# Patient Record
Sex: Female | Born: 1995 | Race: White | Hispanic: No | Smoking: Never smoker
Health system: Southern US, Community
[De-identification: ages and names within clinical notes are randomized; demographics above are authoritative.]

## PROBLEM LIST (undated history)

## (undated) DIAGNOSIS — F319 Bipolar disorder, unspecified: Secondary | ICD-10-CM

## (undated) DIAGNOSIS — Z8759 Personal history of other complications of pregnancy, childbirth and the puerperium: Secondary | ICD-10-CM

## (undated) DIAGNOSIS — G43509 Persistent migraine aura without cerebral infarction, not intractable, without status migrainosus: Secondary | ICD-10-CM

## (undated) DIAGNOSIS — B009 Herpesviral infection, unspecified: Secondary | ICD-10-CM

## (undated) DIAGNOSIS — F429 Obsessive-compulsive disorder, unspecified: Secondary | ICD-10-CM

## (undated) DIAGNOSIS — O1490 Unspecified pre-eclampsia, unspecified trimester: Secondary | ICD-10-CM

## (undated) DIAGNOSIS — T7422XA Child sexual abuse, confirmed, initial encounter: Secondary | ICD-10-CM

## (undated) DIAGNOSIS — F32A Depression, unspecified: Secondary | ICD-10-CM

## (undated) DIAGNOSIS — F419 Anxiety disorder, unspecified: Secondary | ICD-10-CM

## (undated) DIAGNOSIS — K08409 Partial loss of teeth, unspecified cause, unspecified class: Secondary | ICD-10-CM

## (undated) HISTORY — DX: Depression, unspecified: F32.A

## (undated) HISTORY — DX: Personal history of other complications of pregnancy, childbirth and the puerperium: Z87.59

## (undated) HISTORY — DX: Persistent migraine aura without cerebral infarction, not intractable, without status migrainosus: G43.509

## (undated) HISTORY — DX: Herpesviral infection, unspecified: B00.9

## (undated) HISTORY — PX: OTHER SURGICAL HISTORY: SHX169

## (undated) HISTORY — DX: Bipolar disorder, unspecified: F31.9

## (undated) HISTORY — DX: Anxiety disorder, unspecified: F41.9

## (undated) HISTORY — DX: Unspecified pre-eclampsia, unspecified trimester: O14.90

## (undated) HISTORY — DX: Child sexual abuse, confirmed, initial encounter: T74.22XA

## (undated) HISTORY — PX: WISDOM TOOTH EXTRACTION: SHX21

## (undated) HISTORY — PX: TONSILLECTOMY: SUR1361

## (undated) HISTORY — PX: TYMPANOSTOMY TUBE PLACEMENT: SHX32

## (undated) HISTORY — DX: Partial loss of teeth, unspecified cause, unspecified class: K08.409

## (undated) HISTORY — DX: Obsessive-compulsive disorder, unspecified: F42.9

---
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
## 2017-10-04 DIAGNOSIS — O10919 Unspecified pre-existing hypertension complicating pregnancy, unspecified trimester: Secondary | ICD-10-CM | POA: Insufficient documentation

## 2021-06-24 DIAGNOSIS — F99 Mental disorder, not otherwise specified: Secondary | ICD-10-CM | POA: Insufficient documentation

## 2021-06-24 DIAGNOSIS — O0990 Supervision of high risk pregnancy, unspecified, unspecified trimester: Secondary | ICD-10-CM | POA: Insufficient documentation

## 2021-06-24 DIAGNOSIS — Z3480 Encounter for supervision of other normal pregnancy, unspecified trimester: Secondary | ICD-10-CM | POA: Insufficient documentation

## 2021-06-24 LAB — OB RESULTS CONSOLE GC/CHLAMYDIA
Chlamydia: NEGATIVE
Neisseria Gonorrhea: NEGATIVE

## 2021-06-24 LAB — OB RESULTS CONSOLE HEPATITIS B SURFACE ANTIGEN: Hepatitis B Surface Ag: NEGATIVE

## 2021-06-24 LAB — HEPATITIS C ANTIBODY: HCV Ab: NEGATIVE

## 2021-06-26 DIAGNOSIS — O2340 Unspecified infection of urinary tract in pregnancy, unspecified trimester: Secondary | ICD-10-CM | POA: Insufficient documentation

## 2021-06-28 NOTE — L&D Delivery Note (Signed)
OB/GYN Faculty Practice Delivery Note  Amber Dennis is a 26 y.o. [id number] s/p SVD at [redacted]w[redacted]d. She was admitted for IOL for cHTN with superimposed severe pre-eclampsia.   ROM: 6h 25m with clear fluid GBS Status: positive Maximum Maternal Temperature: 98.4  Labor Progress: Presented for IOL, received cytotec, a foley balloon, and was ultimately started on pitocin.  She then [facility] and progressed to complete a few hours later  Delivery Date/Time: 1638 on 7/12 Delivery: Called to room and patient was complete and pushing. Head delivered OA. Nuchal cord x1  present and delivered through. Shoulder and body delivered in usual fashion. Infant withiout spontaneous cry, placed on mother's abdomen, dried and stimulated and tone improved and did start to cry. Cord clamped x 2 after 1-minute delay, and cut by father of baby under my direct supervision. Cord blood drawn. Placenta delivered spontaneously with gentle cord traction. Fundus firm with massage and Pitocin. Labia, perineum, vagina, and cervix inspected and found to have a 1st degree laceration that was repaired with a 4-0 monocryl with one figure of eight stitch.  Of note, due to patient's elevated hemorrhage risk (PreE, Mg, and suspected LGA infant) she was given TXA at delivery and a manual lower uterine segment sweep was done and two medium sized clots were removed.  Placenta: intact, 3V cord, to L&D Complications: none Lacerations: 1st degree EBL: 200cc Analgesia: epidural  Infant: female  APGARs 7,9  3650g  Warner Mccreedy, MD, MPH OB Fellow, Faculty Practice Center for [facility], [facility]

## 2021-07-03 DIAGNOSIS — Z789 Other specified health status: Secondary | ICD-10-CM | POA: Insufficient documentation

## 2021-07-09 DIAGNOSIS — O98519 Other viral diseases complicating pregnancy, unspecified trimester: Secondary | ICD-10-CM | POA: Insufficient documentation

## 2021-07-09 DIAGNOSIS — U071 COVID-19: Secondary | ICD-10-CM | POA: Insufficient documentation

## 2021-07-15 ENCOUNTER — Encounter: Payer: Self-pay | Admitting: *Deleted

## 2021-07-15 DIAGNOSIS — Z3480 Encounter for supervision of other normal pregnancy, unspecified trimester: Secondary | ICD-10-CM | POA: Insufficient documentation

## 2021-07-23 ENCOUNTER — Ambulatory Visit (INDEPENDENT_AMBULATORY_CARE_PROVIDER_SITE_OTHER): Payer: Medicaid Other | Admitting: Obstetrics and Gynecology

## 2021-07-23 ENCOUNTER — Encounter: Payer: Self-pay | Admitting: Obstetrics and Gynecology

## 2021-07-23 ENCOUNTER — Other Ambulatory Visit: Payer: Self-pay

## 2021-07-23 VITALS — BP 139/83 | HR 102 | Ht 66.0 in | Wt 191.0 lb

## 2021-07-23 DIAGNOSIS — Z3A13 13 weeks gestation of pregnancy: Secondary | ICD-10-CM

## 2021-07-23 DIAGNOSIS — O98311 Other infections with a predominantly sexual mode of transmission complicating pregnancy, first trimester: Secondary | ICD-10-CM

## 2021-07-23 DIAGNOSIS — Z789 Other specified health status: Secondary | ICD-10-CM | POA: Diagnosis not present

## 2021-07-23 DIAGNOSIS — Z3480 Encounter for supervision of other normal pregnancy, unspecified trimester: Secondary | ICD-10-CM

## 2021-07-23 DIAGNOSIS — O10911 Unspecified pre-existing hypertension complicating pregnancy, first trimester: Secondary | ICD-10-CM

## 2021-07-23 DIAGNOSIS — O10919 Unspecified pre-existing hypertension complicating pregnancy, unspecified trimester: Secondary | ICD-10-CM

## 2021-07-23 DIAGNOSIS — A6000 Herpesviral infection of urogenital system, unspecified: Secondary | ICD-10-CM

## 2021-07-23 NOTE — Patient Instructions (Signed)
Magnesium glycinate Vitamin B12
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
## 2021-07-24 LAB — PROTEIN / CREATININE RATIO, URINE
Creatinine, Urine: 140 mg/dL (ref 20–275)
Protein/Creat Ratio: 79 mg/g creat (ref 24–184)
Protein/Creatinine Ratio: 0.079 mg/mg creat (ref 0.024–0.184)
Total Protein, Urine: 11 mg/dL (ref 5–24)

## 2021-08-04 ENCOUNTER — Encounter: Payer: Self-pay | Admitting: *Deleted

## 2021-08-04 DIAGNOSIS — Z3480 Encounter for supervision of other normal pregnancy, unspecified trimester: Secondary | ICD-10-CM

## 2021-08-18 ENCOUNTER — Ambulatory Visit (INDEPENDENT_AMBULATORY_CARE_PROVIDER_SITE_OTHER): Payer: Medicaid Other | Admitting: Advanced Practice Midwife

## 2021-08-18 ENCOUNTER — Other Ambulatory Visit: Payer: Self-pay

## 2021-08-18 VITALS — BP 120/82 | HR 90 | Wt 191.0 lb

## 2021-08-18 DIAGNOSIS — L918 Other hypertrophic disorders of the skin: Secondary | ICD-10-CM

## 2021-08-18 DIAGNOSIS — O10919 Unspecified pre-existing hypertension complicating pregnancy, unspecified trimester: Secondary | ICD-10-CM

## 2021-08-18 DIAGNOSIS — Z3480 Encounter for supervision of other normal pregnancy, unspecified trimester: Secondary | ICD-10-CM

## 2021-08-18 DIAGNOSIS — O99712 Diseases of the skin and subcutaneous tissue complicating pregnancy, second trimester: Secondary | ICD-10-CM

## 2021-08-18 DIAGNOSIS — Z3A16 16 weeks gestation of pregnancy: Secondary | ICD-10-CM

## 2021-08-18 DIAGNOSIS — R102 Pelvic and perineal pain: Secondary | ICD-10-CM

## 2021-08-18 DIAGNOSIS — B372 Candidiasis of skin and nail: Secondary | ICD-10-CM

## 2021-08-18 DIAGNOSIS — O26899 Other specified pregnancy related conditions, unspecified trimester: Secondary | ICD-10-CM

## 2021-08-18 MED ORDER — NYSTATIN 100000 UNIT/GM EX CREA: 1.0000 "application " | TOPICAL_CREAM | Freq: Two times a day (BID) | CUTANEOUS | 1 refills | Status: DC

## 2021-08-18 NOTE — Progress Notes (Signed)
° °  PRENATAL VISIT NOTE  Subjective:  Amber Dennis is a 26 y.o. G6P0040 at [redacted]w[redacted]d being seen today for ongoing prenatal care.  She is currently monitored for the following issues for this low-risk pregnancy and has Supervision of other normal pregnancy, antepartum; COVID-19 affecting pregnancy, antepartum; Genital herpes; Chronic hypertension affecting pregnancy; Migraine with aura and without status migrainosus, not intractable; Not immune to rubella; and Chronic mental illness on their problem list.  Patient reports  abdominal cramping after walking yesterday, resolved; skin problems including dry patches, red bumps, groin rash, and skin tag on abdomen .  Contractions: Not present. Vag. Bleeding: None.  Movement: Absent. Denies leaking of fluid.   The following portions of the patient's history were reviewed and updated as appropriate: allergies, current medications, past family history, past medical history, past social history, past surgical history and problem list.   Objective:   Vitals:   08/18/21 1427  BP: 120/82  Pulse: 90  Weight: 191 lb (86.6 kg)    Fetal Status: Fetal Heart Rate (bpm): 157   Movement: Absent     General:  Alert, oriented and cooperative. Patient is in no acute distress.  Skin: Skin is warm and dry. No rash noted.   Cardiovascular: Normal heart rate noted  Respiratory: Normal respiratory effort, no problems with respiration noted  Abdomen: Soft, gravid, appropriate for gestational age.  Pain/Pressure: Absent     Pelvic: Cervical exam deferred        Extremities: Normal range of motion.  Edema: None  Mental Status: Normal mood and affect. Normal behavior. Normal judgment and thought content.   Assessment and Plan:  Pregnancy: G6P0040 at [redacted]w[redacted]d 1. Chronic hypertension affecting pregnancy --BP wnl today, no meds  2. Supervision of other normal pregnancy, antepartum --Anticipatory guidance about next visits/weeks of pregnancy given.  --Next appt in 4  weeks --Anatomy US scheduled 3/3 - Alpha fetoprotein, maternal  3. [redacted] weeks gestation of pregnancy   4. Candida infection of flexural skin --edema, erythema, flaking with satellite lesions c/w candida of right groin area.  --Rx for Nystatin cream  5. Skin problem during pregnancy, antepartum, second trimester --Patch on face with dryness, no clear signs of eczema but may be eczema like.   --Try OTC hydrocortisone cream BID until improved, notify us if not improving and can try stronger topical steroid. --Petichiae/small red raised bumps on both arms/shoulders without pain or itching --Skin tag on abdomen causing irritation/pain. Pt put band-aid on it yesterday but had reaction to band-aid and now has redness where bandage was against skin.    - Ambulatory referral to Dermatology  6. Skin tag --No edema or erythema at skin tag, evidence of band-aid outline with mild erythema near skin tag. - Ambulatory referral to Dermatology  7. Pain of round ligament affecting pregnancy, antepartum --Rest/ice/heat/warm bath/increase PO fluids/Tylenol/pregnancy support belt    Preterm labor symptoms and general obstetric precautions including but not limited to vaginal bleeding, contractions, leaking of fluid and fetal movement were reviewed in detail with the patient. Please refer to After Visit Summary for other counseling recommendations.   No follow-ups on file.  Future Appointments  Date Time Provider Department Center  09/02/2021  2:00 PM [facility] NURSE [facility]  09/02/2021  2:15 PM [facility] US2 [facility]    Sharen Counter, CNM

## 2021-08-18 NOTE — Progress Notes (Signed)
Pt has noticed her skin is getting irritated easily

## 2021-08-19 LAB — ALPHA FETOPROTEIN, MATERNAL
AFP MoM: 1.58
AFP, Serum: 51.1 ng/mL
Calc'd Gestational Age: 16.9 weeks
Maternal Wt: 191 [lb_av]
Risk for ONTD: 1
Twins-AFP: 1

## 2021-09-02 ENCOUNTER — Ambulatory Visit: Payer: Medicaid Other | Attending: Obstetrics and Gynecology

## 2021-09-02 ENCOUNTER — Other Ambulatory Visit: Payer: Self-pay

## 2021-09-02 ENCOUNTER — Ambulatory Visit: Payer: Medicaid Other | Admitting: *Deleted

## 2021-09-02 VITALS — BP 129/80 | HR 103

## 2021-09-02 DIAGNOSIS — Z3A19 19 weeks gestation of pregnancy: Secondary | ICD-10-CM | POA: Insufficient documentation

## 2021-09-02 DIAGNOSIS — Z3480 Encounter for supervision of other normal pregnancy, unspecified trimester: Secondary | ICD-10-CM

## 2021-09-02 DIAGNOSIS — Z363 Encounter for antenatal screening for malformations: Secondary | ICD-10-CM | POA: Insufficient documentation

## 2021-09-02 DIAGNOSIS — O10012 Pre-existing essential hypertension complicating pregnancy, second trimester: Secondary | ICD-10-CM | POA: Insufficient documentation

## 2021-09-03 ENCOUNTER — Other Ambulatory Visit: Payer: Self-pay | Admitting: *Deleted

## 2021-09-03 DIAGNOSIS — O10919 Unspecified pre-existing hypertension complicating pregnancy, unspecified trimester: Secondary | ICD-10-CM

## 2021-09-03 DIAGNOSIS — Z362 Encounter for other antenatal screening follow-up: Secondary | ICD-10-CM

## 2021-09-04 ENCOUNTER — Encounter: Payer: Self-pay | Admitting: Obstetrics and Gynecology

## 2021-09-15 ENCOUNTER — Encounter: Payer: Self-pay | Admitting: Advanced Practice Midwife

## 2021-09-15 ENCOUNTER — Ambulatory Visit (INDEPENDENT_AMBULATORY_CARE_PROVIDER_SITE_OTHER): Payer: Medicaid Other | Admitting: Advanced Practice Midwife

## 2021-09-15 ENCOUNTER — Other Ambulatory Visit: Payer: Self-pay

## 2021-09-15 VITALS — BP 119/80 | HR 98 | Wt 200.0 lb

## 2021-09-15 DIAGNOSIS — R102 Pelvic and perineal pain: Secondary | ICD-10-CM

## 2021-09-15 DIAGNOSIS — O10919 Unspecified pre-existing hypertension complicating pregnancy, unspecified trimester: Secondary | ICD-10-CM

## 2021-09-15 DIAGNOSIS — O0992 Supervision of high risk pregnancy, unspecified, second trimester: Secondary | ICD-10-CM

## 2021-09-15 DIAGNOSIS — O26892 Other specified pregnancy related conditions, second trimester: Secondary | ICD-10-CM

## 2021-09-15 DIAGNOSIS — O10912 Unspecified pre-existing hypertension complicating pregnancy, second trimester: Secondary | ICD-10-CM

## 2021-09-15 DIAGNOSIS — O0990 Supervision of high risk pregnancy, unspecified, unspecified trimester: Secondary | ICD-10-CM

## 2021-09-15 DIAGNOSIS — Z8759 Personal history of other complications of pregnancy, childbirth and the puerperium: Secondary | ICD-10-CM

## 2021-09-15 DIAGNOSIS — Z3A20 20 weeks gestation of pregnancy: Secondary | ICD-10-CM

## 2021-09-15 NOTE — Progress Notes (Signed)
? ?  PRENATAL VISIT NOTE ? ?Subjective:  ?Amber Dennis is a 26 y.o. [id number] at [redacted]w[redacted]d being seen today for ongoing prenatal care.  She is currently monitored for the following issues for this high-risk pregnancy and has Supervision of high risk pregnancy, antepartum; COVID-19 affecting pregnancy, antepartum; Genital herpes; Chronic hypertension affecting pregnancy; Migraine with aura and without status migrainosus, not intractable; Not immune to rubella; and Chronic mental illness on their problem list. ? ?Patient reports  sharp shooting vaginal pains that are intermittent, related to baby movement .  Contractions: Not present. Vag. Bleeding: None.  Movement: Present. Denies leaking of fluid.  ? ?The following portions of the patient's history were reviewed and updated as appropriate: allergies, current medications, past family history, past medical history, past social history, past surgical history and problem list.  ? ?Objective:  ? ?Vitals:  ? 09/15/21 1355  ?BP: 119/80  ?Pulse: 98  ?Weight: 200 lb (90.7 kg)  ? ? ?Fetal Status: Fetal Heart Rate (bpm): 149   Movement: Present    ? ?General:  Alert, oriented and cooperative. Patient is in no acute distress.  ?Skin: Skin is warm and dry. No rash noted.   ?Cardiovascular: Normal heart rate noted  ?Respiratory: Normal respiratory effort, no problems with respiration noted  ?Abdomen: Soft, gravid, appropriate for gestational age.  Pain/Pressure: Present     ?Pelvic: Cervical exam deferred        ?Extremities: Normal range of motion.  Edema: None  ?Mental Status: Normal mood and affect. Normal behavior. Normal judgment and thought content.  ? ?Assessment and Plan:  ?Pregnancy: [id number] at [redacted]w[redacted]d ?1. Chronic hypertension affecting pregnancy ?--No meds, normotensive today ? ?2. Supervision of high risk pregnancy, antepartum ?--Anticipatory guidance about next visits/weeks of pregnancy given.  ? ? ?3. [redacted] weeks gestation of pregnancy ? ? ?4. History of delivery of macrosomal  infant ?--Hx mother with Type 2 DM and macrosomia with first baby ?--Add early GDM screen with A1C ? ?- Hemoglobin A1c ? ?5. Pelvic pain affecting pregnancy in second trimester, antepartum ?--Rest/ice/heat/warm bath/increase PO fluids/Tylenol/pregnancy support belt ? ? ?Preterm labor symptoms and general obstetric precautions including but not limited to vaginal bleeding, contractions, leaking of fluid and fetal movement were reviewed in detail with the patient. ?Please refer to After Visit Summary for other counseling recommendations.  ? ?Return in about 4 weeks (around 10/13/2021) for Any provider, HROB. ? ?Future Appointments  ?Date Time Provider [place]  ?09/30/2021  2:30 PM [facility] NURSE [facility]  ?09/30/2021  2:45 PM [facility] US4 [facility]  ?10/13/2021  2:10 PM Leftwich-Kirby, Kathie Dike, CNM [facility]  ? ? ?Fatima Blank, CNM  ?

## 2021-09-16 LAB — HEMOGLOBIN A1C
Hgb A1c MFr Bld: 5.2 % of total Hgb (ref <5.7)
Mean Plasma Glucose: 103 mg/dL
eAG (mmol/L): 5.7 mmol/L

## 2021-09-30 ENCOUNTER — Ambulatory Visit: Payer: Medicaid Other | Attending: Maternal & Fetal Medicine

## 2021-09-30 ENCOUNTER — Other Ambulatory Visit: Payer: Self-pay | Admitting: *Deleted

## 2021-09-30 ENCOUNTER — Ambulatory Visit: Payer: Medicaid Other | Admitting: *Deleted

## 2021-09-30 VITALS — BP 122/72 | HR 103

## 2021-09-30 DIAGNOSIS — O10919 Unspecified pre-existing hypertension complicating pregnancy, unspecified trimester: Secondary | ICD-10-CM

## 2021-09-30 DIAGNOSIS — O10912 Unspecified pre-existing hypertension complicating pregnancy, second trimester: Secondary | ICD-10-CM

## 2021-09-30 DIAGNOSIS — Z362 Encounter for other antenatal screening follow-up: Secondary | ICD-10-CM | POA: Diagnosis not present

## 2021-09-30 DIAGNOSIS — O0990 Supervision of high risk pregnancy, unspecified, unspecified trimester: Secondary | ICD-10-CM | POA: Diagnosis present

## 2021-09-30 DIAGNOSIS — Z3A23 23 weeks gestation of pregnancy: Secondary | ICD-10-CM | POA: Diagnosis not present

## 2021-09-30 DIAGNOSIS — O10012 Pre-existing essential hypertension complicating pregnancy, second trimester: Secondary | ICD-10-CM | POA: Diagnosis not present

## 2021-10-13 ENCOUNTER — Ambulatory Visit (INDEPENDENT_AMBULATORY_CARE_PROVIDER_SITE_OTHER): Payer: Medicaid Other | Admitting: Advanced Practice Midwife

## 2021-10-13 VITALS — BP 136/76 | HR 102 | Wt 206.0 lb

## 2021-10-13 DIAGNOSIS — O0990 Supervision of high risk pregnancy, unspecified, unspecified trimester: Secondary | ICD-10-CM

## 2021-10-13 DIAGNOSIS — O10919 Unspecified pre-existing hypertension complicating pregnancy, unspecified trimester: Secondary | ICD-10-CM

## 2021-10-13 DIAGNOSIS — Z3A24 24 weeks gestation of pregnancy: Secondary | ICD-10-CM

## 2021-10-13 NOTE — Progress Notes (Signed)
? ?  PRENATAL VISIT NOTE ? ?Subjective:  ?Amber Dennis is a 26 y.o. [phone] at [redacted]w[redacted]d being seen today for ongoing prenatal care.  She is currently monitored for the following issues for this high-risk pregnancy and has Supervision of high risk pregnancy, antepartum; COVID-19 affecting pregnancy, antepartum; Genital herpes; Chronic hypertension affecting pregnancy; Migraine with aura and without status migrainosus, not intractable; Not immune to rubella; and Chronic mental illness on their problem list. ? ?Patient reports no complaints.  Contractions: Not present. Vag. Bleeding: None.  Movement: Present. Denies leaking of fluid.  ? ?The following portions of the patient's history were reviewed and updated as appropriate: allergies, current medications, past family history, past medical history, past social history, past surgical history and problem list.  ? ?Objective:  ? ?Vitals:  ? 10/13/21 1401  ?BP: 136/76  ?Pulse: (!) 102  ?Weight: 206 lb (93.4 kg)  ? ? ?Fetal Status: Fetal Heart Rate (bpm): 141 Fundal Height: 24 cm Movement: Present    ? ?General:  Alert, oriented and cooperative. Patient is in no acute distress.  ?Skin: Skin is warm and dry. No rash noted.   ?Cardiovascular: Normal heart rate noted  ?Respiratory: Normal respiratory effort, no problems with respiration noted  ?Abdomen: Soft, gravid, appropriate for gestational age.  Pain/Pressure: Absent     ?Pelvic: Cervical exam deferred        ?Extremities: Normal range of motion.  Edema: None  ?Mental Status: Normal mood and affect. Normal behavior. Normal judgment and thought content.  ? ?Assessment and Plan:  ?Pregnancy: [id number] at [redacted]w[redacted]d ?1. Supervision of high risk pregnancy, antepartum ?--Anticipatory guidance about next visits/weeks of pregnancy given.  ? ?2. Chronic hypertension affecting pregnancy ?--No meds, BP wnl today ? ?3. [redacted] weeks gestation of pregnancy ? ? ?Preterm labor symptoms and general obstetric precautions including but not limited to  vaginal bleeding, contractions, leaking of fluid and fetal movement were reviewed in detail with the patient. ?Please refer to After Visit Summary for other counseling recommendations.  ? ?Return in about 4 weeks (around 11/10/2021) for HROB, GTT at next visit. ? ?Future Appointments  ?Date Time Provider Department Center  ?10/28/2021  3:30 PM [facility] NURSE [facility]  ?10/28/2021  3:45 PM [facility] US1 [facility]  ?11/10/2021  9:30 AM Brand Males, CNM [facility]  ? ? ?Sharen Counter, CNM  ?

## 2021-10-19 ENCOUNTER — Encounter: Payer: Self-pay | Admitting: Advanced Practice Midwife

## 2021-10-20 ENCOUNTER — Other Ambulatory Visit: Payer: Self-pay | Admitting: Advanced Practice Midwife

## 2021-10-20 DIAGNOSIS — F410 Panic disorder [episodic paroxysmal anxiety] without agoraphobia: Secondary | ICD-10-CM

## 2021-10-20 MED ORDER — BUSPIRONE HCL 5 MG PO TABS: 5.0000 mg | ORAL_TABLET | Freq: Three times a day (TID) | ORAL | 0 refills | Status: AC | PRN

## 2021-10-20 NOTE — Progress Notes (Signed)
Pt discussed anxiety, occasional panic attacks at her prenatal visit on 10/13/21.  Pt sent Mychart message on 4/25 requesting to start medications discussed at that visit.  Rx for Buspar 5 mg TID PRN sent to pharmacy. Pt to f/u in office in 2 weeks.  ?

## 2021-10-28 ENCOUNTER — Other Ambulatory Visit: Payer: Self-pay | Admitting: *Deleted

## 2021-10-28 ENCOUNTER — Ambulatory Visit: Payer: Medicaid Other | Admitting: *Deleted

## 2021-10-28 ENCOUNTER — Encounter: Payer: Self-pay | Admitting: *Deleted

## 2021-10-28 ENCOUNTER — Ambulatory Visit: Payer: Medicaid Other | Attending: Maternal & Fetal Medicine

## 2021-10-28 VITALS — BP 116/69 | HR 80

## 2021-10-28 DIAGNOSIS — O0990 Supervision of high risk pregnancy, unspecified, unspecified trimester: Secondary | ICD-10-CM

## 2021-10-28 DIAGNOSIS — O10912 Unspecified pre-existing hypertension complicating pregnancy, second trimester: Secondary | ICD-10-CM | POA: Insufficient documentation

## 2021-10-28 DIAGNOSIS — Z6831 Body mass index (BMI) 31.0-31.9, adult: Secondary | ICD-10-CM

## 2021-10-28 DIAGNOSIS — O10012 Pre-existing essential hypertension complicating pregnancy, second trimester: Secondary | ICD-10-CM

## 2021-10-28 DIAGNOSIS — Z3A27 27 weeks gestation of pregnancy: Secondary | ICD-10-CM

## 2021-10-28 DIAGNOSIS — O10919 Unspecified pre-existing hypertension complicating pregnancy, unspecified trimester: Secondary | ICD-10-CM

## 2021-11-10 ENCOUNTER — Ambulatory Visit (INDEPENDENT_AMBULATORY_CARE_PROVIDER_SITE_OTHER): Payer: Medicaid Other

## 2021-11-10 VITALS — BP 126/88 | HR 98 | Wt 216.0 lb

## 2021-11-10 DIAGNOSIS — O0992 Supervision of high risk pregnancy, unspecified, second trimester: Secondary | ICD-10-CM

## 2021-11-10 DIAGNOSIS — O10919 Unspecified pre-existing hypertension complicating pregnancy, unspecified trimester: Secondary | ICD-10-CM

## 2021-11-10 DIAGNOSIS — O0990 Supervision of high risk pregnancy, unspecified, unspecified trimester: Secondary | ICD-10-CM | POA: Diagnosis not present

## 2021-11-10 DIAGNOSIS — O99342 Other mental disorders complicating pregnancy, second trimester: Secondary | ICD-10-CM

## 2021-11-10 DIAGNOSIS — Z3A28 28 weeks gestation of pregnancy: Secondary | ICD-10-CM

## 2021-11-10 DIAGNOSIS — F419 Anxiety disorder, unspecified: Secondary | ICD-10-CM

## 2021-11-10 DIAGNOSIS — O10912 Unspecified pre-existing hypertension complicating pregnancy, second trimester: Secondary | ICD-10-CM

## 2021-11-10 NOTE — Progress Notes (Signed)
Declined Tdap  

## 2021-11-10 NOTE — Progress Notes (Signed)
? ?  PRENATAL VISIT NOTE ? ?Subjective:  ?Amber Dennis is a 26 y.o. [phone] at [redacted]w[redacted]d being seen today for ongoing prenatal care.  She is currently monitored for the following issues for this high-risk pregnancy and has Supervision of high risk pregnancy, antepartum; COVID-19 affecting pregnancy, antepartum; Genital herpes; Chronic hypertension affecting pregnancy; Migraine with aura and without status migrainosus, not intractable; Not immune to rubella; and Chronic mental illness on their problem list. ? ?Patient reports no complaints.  Contractions: Not present. Vag. Bleeding: None.  Movement: Present. Denies leaking of fluid.  ? ?The following portions of the patient's history were reviewed and updated as appropriate: allergies, current medications, past family history, past medical history, past social history, past surgical history and problem list.  ? ?Objective:  ? ?Vitals:  ? 11/10/21 0930  ?BP: 126/88  ?Pulse: 98  ?Weight: 216 lb (98 kg)  ? ? ?Fetal Status: Fetal Heart Rate (bpm): 141 Fundal Height: 29 cm Movement: Present    ? ?General:  Alert, oriented and cooperative. Patient is in no acute distress.  ?Skin: Skin is warm and dry. No rash noted.   ?Cardiovascular: Normal heart rate noted  ?Respiratory: Normal respiratory effort, no problems with respiration noted  ?Abdomen: Soft, gravid, appropriate for gestational age.  Pain/Pressure: Absent     ?Pelvic: Cervical exam deferred        ?Extremities: Normal range of motion.  Edema: None  ?Mental Status: Normal mood and affect. Normal behavior. Normal judgment and thought content.  ? ?Assessment and Plan:  ?Pregnancy: [id number] at [redacted]w[redacted]d ?1. Supervision of high risk pregnancy, antepartum ?- Routine OB. Doing well, no concerns ?- GTT and labs today ?- Partner is planning on vasectomy. Waiting to get appointment scheduled ?- Wants Tdap at next visit  ? ?- 2Hr GTT w/ 1 Hr Carpenter 75 g ?- HIV antibody (with reflex) ?- CBC ?- RPR ? ?2. [redacted] weeks gestation of  pregnancy ?- Endorses active fetal movement ?- FH appropriate ? ?3. Chronic hypertension affecting pregnancy ?- No meds ?- BP normotensive ? ?4. Anxiety in pregnancy, antepartum ?- Has Buspar 5mg  TID prn ?- Patient has been taking 1-2x daily prn ?- Feels like anxiety is well controlled ? ? ? ?Preterm labor symptoms and general obstetric precautions including but not limited to vaginal bleeding, contractions, leaking of fluid and fetal movement were reviewed in detail with the patient. ?Please refer to After Visit Summary for other counseling recommendations.  ? ?Return in about 2 weeks (around 11/24/2021). ? ?Future Appointments  ?Date Time Provider [place]  ?11/26/2021  3:50 PM Radene Gunning, MD [facility]  ?12/02/2021  3:30 PM [facility] NURSE [facility]  ?12/02/2021  3:45 PM [facility] US4 [facility] [place]  ? ? ?Renee Harder, CNM ? ?

## 2021-11-11 LAB — CBC
HCT: 35.4 % (ref 35.0–45.0)
Hemoglobin: 11.8 g/dL (ref 11.7–15.5)
MCH: 28.2 pg (ref 27.0–33.0)
MCHC: 33.3 g/dL (ref 32.0–36.0)
MCV: 84.7 fL (ref 80.0–100.0)
MPV: 12 fL (ref 7.5–12.5)
Platelets: 182 10*3/uL (ref 140–400)
RBC: 4.18 10*6/uL (ref 3.80–5.10)
RDW: 12.9 % (ref 11.0–15.0)
WBC: 12 10*3/uL — ABNORMAL HIGH (ref 3.8–10.8)

## 2021-11-11 LAB — RPR: RPR Ser Ql: NONREACTIVE

## 2021-11-11 LAB — 2HR GTT W 1 HR, CARPENTER, 75 G
Glucose, 1 Hr, Gest: 142 mg/dL (ref 65–179)
Glucose, 2 Hr, Gest: 130 mg/dL (ref 65–152)
Glucose, Fasting, Gest: 84 mg/dL (ref 65–91)

## 2021-11-11 LAB — HIV ANTIBODY (ROUTINE TESTING W REFLEX): HIV 1&2 Ab, 4th Generation: NONREACTIVE

## 2021-11-19 NOTE — Progress Notes (Signed)
PRENATAL VISIT NOTE  Subjective:  Amber Dennis is a 25 y.o. G6P1041 at [redacted]w[redacted]d being seen today for ongoing prenatal care.  She is currently monitored for the following issues for this high-risk pregnancy and has Supervision of high risk pregnancy, antepartum; COVID-19 affecting pregnancy, antepartum; Genital herpes; Chronic hypertension affecting pregnancy; Migraine with aura and without status migrainosus, not intractable; Not immune to rubella; and Chronic mental illness on their problem list.  Patient reports  LUQ pain  - not deep but not just skin. Hurts with bending and sitting up.   Contractions: Not present. Vag. Bleeding: None.  Movement: Present. Denies leaking of fluid.   The following portions of the patient's history were reviewed and updated as appropriate: allergies, current medications, past family history, past medical history, past social history, past surgical history and problem list.   Objective:   Vitals:   11/26/21 1548  BP: 119/80  Pulse: (!) 102  Weight: 223 lb (101.2 kg)    Fetal Status: Fetal Heart Rate (bpm): 152 Fundal Height: 32 cm Movement: Present     General:  Alert, oriented and cooperative. Patient is in no acute distress.  Skin: Skin is warm and dry. No rash noted.   Cardiovascular: Normal heart rate noted  Respiratory: Normal respiratory effort, no problems with respiration noted  Abdomen: Soft, gravid, appropriate for gestational age.  Pain/Pressure: Absent   Tender to light touch in LUQ most c/w MSK pain  Pelvic: Cervical exam deferred        Extremities: Normal range of motion.  Edema: None  Mental Status: Normal mood and affect. Normal behavior. Normal judgment and thought content.   Assessment and Plan:  Pregnancy: G6P1041 at [redacted]w[redacted]d 1. Chronic hypertension affecting pregnancy - BASA, no meds - Serial growth US - 5/3 was 94%ile. Next is 6/7. Has h/o macrosomia at delivery without complications (9lb7oz).   2. Genital herpes simplex,  unspecified site Valtrex prophy at 36w  3. Supervision of high risk pregnancy, antepartum 28 wk labs wnl TDAP recommended today - pt accepts  4. Not immune to rubella MMR after delivery  Preterm labor symptoms and general obstetric precautions including but not limited to vaginal bleeding, contractions, leaking of fluid and fetal movement were reviewed in detail with the patient. Please refer to After Visit Summary for other counseling recommendations.   Return in about 2 weeks (around 12/10/2021) for OB VISIT, MD or APP.  Future Appointments  Date Time Provider Department Center  12/02/2021  3:30 PM [facility] NURSE [facility]  12/02/2021  3:45 PM [facility] US4 [facility]  12/17/2021  4:10 PM Davis, Kelly M, MD [facility]    Paula Duncan, MD 

## 2021-11-26 ENCOUNTER — Encounter: Payer: Self-pay | Admitting: Obstetrics and Gynecology

## 2021-11-26 ENCOUNTER — Ambulatory Visit (INDEPENDENT_AMBULATORY_CARE_PROVIDER_SITE_OTHER): Payer: Medicaid Other | Admitting: Obstetrics and Gynecology

## 2021-11-26 VITALS — BP 119/80 | HR 102 | Wt 223.0 lb

## 2021-11-26 DIAGNOSIS — O10919 Unspecified pre-existing hypertension complicating pregnancy, unspecified trimester: Secondary | ICD-10-CM

## 2021-11-26 DIAGNOSIS — Z789 Other specified health status: Secondary | ICD-10-CM

## 2021-11-26 DIAGNOSIS — O10913 Unspecified pre-existing hypertension complicating pregnancy, third trimester: Secondary | ICD-10-CM

## 2021-11-26 DIAGNOSIS — A6000 Herpesviral infection of urogenital system, unspecified: Secondary | ICD-10-CM

## 2021-11-26 DIAGNOSIS — O0993 Supervision of high risk pregnancy, unspecified, third trimester: Secondary | ICD-10-CM

## 2021-11-26 DIAGNOSIS — Z23 Encounter for immunization: Secondary | ICD-10-CM | POA: Diagnosis not present

## 2021-11-26 DIAGNOSIS — Z3A31 31 weeks gestation of pregnancy: Secondary | ICD-10-CM

## 2021-11-26 DIAGNOSIS — O0990 Supervision of high risk pregnancy, unspecified, unspecified trimester: Secondary | ICD-10-CM

## 2021-11-30 ENCOUNTER — Telehealth: Payer: Self-pay | Admitting: *Deleted

## 2021-11-30 NOTE — Telephone Encounter (Signed)
Received a call from Call a nurse stating that pt fainted x 2 this AM and fell onto her son's bed.  She denies hitting her head and baby is moving OK.  Her BP was 141/74.  I have left a message for her to call the office.

## 2021-12-02 ENCOUNTER — Ambulatory Visit: Payer: Medicaid Other | Attending: Obstetrics and Gynecology

## 2021-12-02 ENCOUNTER — Ambulatory Visit: Payer: Medicaid Other | Admitting: *Deleted

## 2021-12-02 VITALS — BP 130/84 | HR 99

## 2021-12-02 DIAGNOSIS — O0990 Supervision of high risk pregnancy, unspecified, unspecified trimester: Secondary | ICD-10-CM | POA: Insufficient documentation

## 2021-12-02 DIAGNOSIS — Z3A32 32 weeks gestation of pregnancy: Secondary | ICD-10-CM | POA: Diagnosis not present

## 2021-12-02 DIAGNOSIS — Z6831 Body mass index (BMI) 31.0-31.9, adult: Secondary | ICD-10-CM | POA: Insufficient documentation

## 2021-12-02 DIAGNOSIS — O10013 Pre-existing essential hypertension complicating pregnancy, third trimester: Secondary | ICD-10-CM | POA: Diagnosis not present

## 2021-12-02 DIAGNOSIS — O10919 Unspecified pre-existing hypertension complicating pregnancy, unspecified trimester: Secondary | ICD-10-CM | POA: Diagnosis not present

## 2021-12-03 ENCOUNTER — Other Ambulatory Visit: Payer: Self-pay | Admitting: *Deleted

## 2021-12-03 DIAGNOSIS — O10913 Unspecified pre-existing hypertension complicating pregnancy, third trimester: Secondary | ICD-10-CM

## 2021-12-17 ENCOUNTER — Encounter: Payer: Self-pay | Admitting: Obstetrics and Gynecology

## 2021-12-17 ENCOUNTER — Ambulatory Visit (INDEPENDENT_AMBULATORY_CARE_PROVIDER_SITE_OTHER): Payer: Medicaid Other | Admitting: Obstetrics and Gynecology

## 2021-12-17 VITALS — BP 121/83 | HR 102 | Wt 228.0 lb

## 2021-12-17 DIAGNOSIS — Z789 Other specified health status: Secondary | ICD-10-CM

## 2021-12-17 DIAGNOSIS — O4290 Premature rupture of membranes, unspecified as to length of time between rupture and onset of labor, unspecified weeks of gestation: Secondary | ICD-10-CM

## 2021-12-17 DIAGNOSIS — A6000 Herpesviral infection of urogenital system, unspecified: Secondary | ICD-10-CM

## 2021-12-17 DIAGNOSIS — Z3A34 34 weeks gestation of pregnancy: Secondary | ICD-10-CM

## 2021-12-17 DIAGNOSIS — O10919 Unspecified pre-existing hypertension complicating pregnancy, unspecified trimester: Secondary | ICD-10-CM

## 2021-12-17 DIAGNOSIS — O98519 Other viral diseases complicating pregnancy, unspecified trimester: Secondary | ICD-10-CM

## 2021-12-17 DIAGNOSIS — O0990 Supervision of high risk pregnancy, unspecified, unspecified trimester: Secondary | ICD-10-CM

## 2021-12-17 DIAGNOSIS — U071 COVID-19: Secondary | ICD-10-CM

## 2021-12-17 MED ORDER — VALACYCLOVIR HCL 500 MG PO TABS: 500.0000 mg | ORAL_TABLET | Freq: Two times a day (BID) | ORAL | 6 refills | Status: DC

## 2021-12-17 NOTE — Progress Notes (Signed)
Wearing a panty liner, she has some fluid, not sure if leaking water or incontinence.

## 2021-12-22 ENCOUNTER — Other Ambulatory Visit: Payer: Self-pay

## 2021-12-22 ENCOUNTER — Encounter (HOSPITAL_COMMUNITY): Payer: Self-pay | Admitting: Obstetrics and Gynecology

## 2021-12-22 ENCOUNTER — Telehealth: Payer: Self-pay | Admitting: Obstetrics and Gynecology

## 2021-12-22 ENCOUNTER — Inpatient Hospital Stay (HOSPITAL_COMMUNITY)
Admission: AD | Admit: 2021-12-22 | Discharge: 2021-12-23 | Disposition: A | Discharge status: Home / Self Care | Payer: Medicaid Other | Attending: Obstetrics and Gynecology | Admitting: Obstetrics and Gynecology

## 2021-12-22 DIAGNOSIS — R600 Localized edema: Secondary | ICD-10-CM

## 2021-12-22 DIAGNOSIS — O10919 Unspecified pre-existing hypertension complicating pregnancy, unspecified trimester: Secondary | ICD-10-CM

## 2021-12-22 DIAGNOSIS — Z3A35 35 weeks gestation of pregnancy: Secondary | ICD-10-CM | POA: Diagnosis not present

## 2021-12-22 DIAGNOSIS — O1203 Gestational edema, third trimester: Secondary | ICD-10-CM | POA: Insufficient documentation

## 2021-12-22 DIAGNOSIS — H538 Other visual disturbances: Secondary | ICD-10-CM | POA: Diagnosis not present

## 2021-12-22 DIAGNOSIS — O26893 Other specified pregnancy related conditions, third trimester: Secondary | ICD-10-CM | POA: Insufficient documentation

## 2021-12-22 DIAGNOSIS — O10913 Unspecified pre-existing hypertension complicating pregnancy, third trimester: Secondary | ICD-10-CM | POA: Insufficient documentation

## 2021-12-22 LAB — CBC
HCT: 31.4 % — ABNORMAL LOW (ref 36.0–46.0)
Hemoglobin: 10.4 g/dL — ABNORMAL LOW (ref 12.0–15.0)
MCH: 26.5 pg (ref 26.0–34.0)
MCHC: 33.1 g/dL (ref 30.0–36.0)
MCV: 79.9 fL — ABNORMAL LOW (ref 80.0–100.0)
Platelets: 227 10*3/uL (ref 150–400)
RBC: 3.93 MIL/uL (ref 3.87–5.11)
RDW: 13.7 % (ref 11.5–15.5)
WBC: 11.7 10*3/uL — ABNORMAL HIGH (ref 4.0–10.5)
nRBC: 0 % (ref 0.0–0.2)
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
## 2021-12-23 ENCOUNTER — Other Ambulatory Visit: Payer: Self-pay

## 2021-12-23 ENCOUNTER — Encounter (HOSPITAL_COMMUNITY): Payer: Self-pay | Admitting: Obstetrics & Gynecology

## 2021-12-23 ENCOUNTER — Telehealth: Payer: Self-pay | Admitting: Obstetrics & Gynecology

## 2021-12-23 ENCOUNTER — Inpatient Hospital Stay (EMERGENCY_DEPARTMENT_HOSPITAL)
Admission: AD | Admit: 2021-12-23 | Discharge: 2021-12-24 | Disposition: A | Discharge status: Home / Self Care | Payer: Medicaid Other | Source: Home / Self Care | Attending: Obstetrics & Gynecology | Admitting: Obstetrics & Gynecology

## 2021-12-23 DIAGNOSIS — Z3A35 35 weeks gestation of pregnancy: Secondary | ICD-10-CM

## 2021-12-23 DIAGNOSIS — R600 Localized edema: Secondary | ICD-10-CM | POA: Diagnosis not present

## 2021-12-23 DIAGNOSIS — O10919 Unspecified pre-existing hypertension complicating pregnancy, unspecified trimester: Secondary | ICD-10-CM

## 2021-12-23 DIAGNOSIS — O10913 Unspecified pre-existing hypertension complicating pregnancy, third trimester: Secondary | ICD-10-CM | POA: Insufficient documentation

## 2021-12-23 LAB — COMPREHENSIVE METABOLIC PANEL
ALT: 16 U/L (ref 0–44)
AST: 15 U/L (ref 15–41)
Albumin: 2.5 g/dL — ABNORMAL LOW (ref 3.5–5.0)
Alkaline Phosphatase: 81 U/L (ref 38–126)
Anion gap: 7 (ref 5–15)
BUN: 6 mg/dL (ref 6–20)
CO2: 23 mmol/L (ref 22–32)
Calcium: 9 mg/dL (ref 8.9–10.3)
Chloride: 109 mmol/L (ref 98–111)
Creatinine, Ser: 0.53 mg/dL (ref 0.44–1.00)
GFR, Estimated: >60 mL/min (ref >60)
Glucose, Bld: 117 mg/dL — ABNORMAL HIGH (ref 70–99)
Potassium: 3.8 mmol/L (ref 3.5–5.1)
Sodium: 139 mmol/L (ref 135–145)
Total Bilirubin: 0.4 mg/dL (ref 0.3–1.2)
Total Protein: 5.6 g/dL — ABNORMAL LOW (ref 6.5–8.1)

## 2021-12-23 LAB — URINALYSIS, ROUTINE W REFLEX MICROSCOPIC
Bilirubin Urine: NEGATIVE
Glucose, UA: NEGATIVE mg/dL
Hgb urine dipstick: NEGATIVE
Ketones, ur: NEGATIVE mg/dL
Nitrite: NEGATIVE
Protein, ur: NEGATIVE mg/dL
Specific Gravity, Urine: 1.016 (ref 1.005–1.030)
pH: 6 (ref 5.0–8.0)

## 2021-12-23 LAB — PROTEIN / CREATININE RATIO, URINE
Creatinine, Urine: 69.47 mg/dL
Protein Creatinine Ratio: 0.12 mg/mg{Cre} (ref 0.00–0.15)
Total Protein, Urine: 8 mg/dL

## 2021-12-23 NOTE — Telephone Encounter (Addendum)
   Faculty Practice OB/GYN Physician [employer] Phone Call Documentation  There was a [employer] notification of elevated BP of 152/102 for [facility].  She is [redacted]w[redacted]d with CHTN, no current medications.  I called Brighten Kintzel at home, and after verifying two patient identifiers, asked about this BP.  Repeat BP was 144/95.  Patient denies any headaches, visual symptoms, RUQ/epigastric pain or other concerning symptoms. She does report being very short of breath, has to catch her breath and feels her leg extremity is worse and more on the left.  Of note, she was evaluated in MAU last night for possible preeclampsia, she ruled out for this.  She was told to go the [facility] MAU for evaluation and management, concerned about possible PE/DVT.  MAU staff notified.    Jaynie Collins, MD, FACOG Obstetrician & Gynecologist, [facility] for [employer], [facility]
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
## 2021-12-23 NOTE — MAU Note (Signed)
.  Amber Dennis is a 26 y.o. at [redacted]w[redacted]d here in MAU reporting: elevated BP starting today around 2030. Pt report she took her bp 152/102 and 144/95. Pt stated she repeated it prior to coming again with a bp of 150/70s. Pt also states she having a constant throbbing pain in her left foot that increases with walking that started Sunday. Pt states her vision has been more difficult for long term watching, but denies floaters or blurry vision. Pt denies all other PIH s/s, DFM, VB, LOF, CTX, abnormal discharge, and other complications in the pregnancy.  HSV on valtrex daily started today, outbreak last 05/2014  Onset of complaint: 2030 Pain score: 3/10 foot Vitals:   12/23/21 2301  BP: (!) 128/91  Pulse: (!) 115  Resp: 20  Temp: 98.1 F (36.7 C)  SpO2: 97%     FHT:150  Lab orders placed from triage:  UA

## 2021-12-23 NOTE — Telephone Encounter (Signed)
     Faculty Practice OB/GYN Physician [employer] Phone Call Documentation  There was another Babys[employer]s notification of elevated BP of 162/75 for [facility].  I had talked to her earlier and she was told to go the [facility] MAU for evaluation and management for earlier elvated BP, SOB and BLE edema with L>R.    I called Maleeya Kovaleski at home, and after verifying two patient identifiers, asked if she was on her way here. She said she wanted to check it one more time before coming in, she is on her way.  MAU provider and staff notified.   Jaynie Collins, MD, FACOG Obstetrician & Gynecologist, [facility] for [employer], [facility]

## 2021-12-24 DIAGNOSIS — Z3A35 35 weeks gestation of pregnancy: Secondary | ICD-10-CM

## 2021-12-24 DIAGNOSIS — O10919 Unspecified pre-existing hypertension complicating pregnancy, unspecified trimester: Secondary | ICD-10-CM

## 2021-12-24 LAB — COMPREHENSIVE METABOLIC PANEL
ALT: 9 U/L (ref 0–44)
AST: 12 U/L — ABNORMAL LOW (ref 15–41)
Albumin: 2.4 g/dL — ABNORMAL LOW (ref 3.5–5.0)
Alkaline Phosphatase: 74 U/L (ref 38–126)
Anion gap: 9 (ref 5–15)
BUN: 9 mg/dL (ref 6–20)
CO2: 23 mmol/L (ref 22–32)
Calcium: 8.6 mg/dL — ABNORMAL LOW (ref 8.9–10.3)
Chloride: 107 mmol/L (ref 98–111)
Creatinine, Ser: 0.81 mg/dL (ref 0.44–1.00)
GFR, Estimated: >60 mL/min (ref >60)
Glucose, Bld: 111 mg/dL — ABNORMAL HIGH (ref 70–99)
Potassium: 4.2 mmol/L (ref 3.5–5.1)
Sodium: 139 mmol/L (ref 135–145)
Total Bilirubin: 0.2 mg/dL — ABNORMAL LOW (ref 0.3–1.2)
Total Protein: 5.5 g/dL — ABNORMAL LOW (ref 6.5–8.1)

## 2021-12-24 LAB — CBC
HCT: 31.2 % — ABNORMAL LOW (ref 36.0–46.0)
Hemoglobin: 9.8 g/dL — ABNORMAL LOW (ref 12.0–15.0)
MCH: 25.4 pg — ABNORMAL LOW (ref 26.0–34.0)
MCHC: 31.4 g/dL (ref 30.0–36.0)
MCV: 80.8 fL (ref 80.0–100.0)
Platelets: 226 10*3/uL (ref 150–400)
RBC: 3.86 MIL/uL — ABNORMAL LOW (ref 3.87–5.11)
RDW: 13.9 % (ref 11.5–15.5)
WBC: 12.6 10*3/uL — ABNORMAL HIGH (ref 4.0–10.5)
nRBC: 0 % (ref 0.0–0.2)

## 2021-12-24 LAB — PROTEIN / CREATININE RATIO, URINE
Creatinine, Urine: 86.33 mg/dL
Protein Creatinine Ratio: 0.13 mg/mg{Cre} (ref 0.00–0.15)
Total Protein, Urine: 11 mg/dL

## 2021-12-25 ENCOUNTER — Encounter: Payer: Self-pay | Admitting: Obstetrics & Gynecology

## 2021-12-25 ENCOUNTER — Emergency Department (INDEPENDENT_AMBULATORY_CARE_PROVIDER_SITE_OTHER)
Admission: EM | Admit: 2021-12-25 | Discharge: 2021-12-25 | Disposition: A | Discharge status: Home / Self Care | Payer: Medicaid Other | Source: Home / Self Care

## 2021-12-25 ENCOUNTER — Encounter: Payer: Self-pay | Admitting: Emergency Medicine

## 2021-12-25 ENCOUNTER — Telehealth: Payer: Self-pay | Admitting: Obstetrics and Gynecology

## 2021-12-25 DIAGNOSIS — K122 Cellulitis and abscess of mouth: Secondary | ICD-10-CM

## 2021-12-25 LAB — POCT RAPID STREP A (OFFICE): Rapid Strep A Screen: NEGATIVE

## 2021-12-25 MED ORDER — AMOXICILLIN 875 MG PO TABS: 875.0000 mg | ORAL_TABLET | Freq: Two times a day (BID) | ORAL | 0 refills | Status: DC

## 2021-12-25 NOTE — Discharge Instructions (Addendum)
Instructed patient to take medication as directed with food to completion.  Encouraged patient increase daily water intake while taking this medication.  Advised patient if symptoms worsen and/or unresolved please follow-up with OB/GYN/PCP or here for further evaluation.

## 2021-12-25 NOTE — Telephone Encounter (Signed)
Spoke with Theo, she was tearful because of poor sleep for about the last 7 days - she is not feeling well and has been having trouble breathing due to her sore throat and swollen uvula (abscess seen by Urgent Care).  Reviewed her note from Urgent Care - She was given amoxicillin for this. Reviewed I would expect improvement in 24 hours especially with getting 2 doses tonight of Augment and 2 more tomorrow. Reviewed OTC measures she could try. Discussed florastor (generic).   Reviewed Bps - she has still mild range Bps in the s/o known CHTN, no meds. She is currently asymptomatic. Reviewed if severe range Bps would advise coming to the hospital - reviewed 160/110 (either one). Labs yesterday done for r/o PreE and negative.   She will focus tonight on sleep and see how she feels in the morning. She will continue to check Bps.   Milas Hock, MD Attending Obstetrician & Gynecologist, [facility] for [facility], [facility]
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
## 2021-12-25 NOTE — ED Triage Notes (Signed)
Pt is [redacted] weeks pregnant and states she has been in the process of being evaluated for sleep apnea over the last week she has been feeling like she is choking and snoring more. Her OBGYN advised she be seen for sore throat that was worse today.

## 2021-12-31 ENCOUNTER — Ambulatory Visit: Payer: Medicaid Other | Attending: Obstetrics and Gynecology

## 2021-12-31 ENCOUNTER — Ambulatory Visit: Payer: Medicaid Other | Admitting: *Deleted

## 2021-12-31 VITALS — BP 138/85 | HR 98

## 2021-12-31 DIAGNOSIS — O10913 Unspecified pre-existing hypertension complicating pregnancy, third trimester: Secondary | ICD-10-CM | POA: Insufficient documentation

## 2021-12-31 DIAGNOSIS — Z3A36 36 weeks gestation of pregnancy: Secondary | ICD-10-CM

## 2021-12-31 DIAGNOSIS — O10013 Pre-existing essential hypertension complicating pregnancy, third trimester: Secondary | ICD-10-CM | POA: Diagnosis not present

## 2021-12-31 DIAGNOSIS — O0990 Supervision of high risk pregnancy, unspecified, unspecified trimester: Secondary | ICD-10-CM

## 2022-01-01 ENCOUNTER — Encounter: Payer: Self-pay | Admitting: Advanced Practice Midwife

## 2022-01-01 ENCOUNTER — Ambulatory Visit (INDEPENDENT_AMBULATORY_CARE_PROVIDER_SITE_OTHER): Payer: Medicaid Other

## 2022-01-01 ENCOUNTER — Other Ambulatory Visit (HOSPITAL_COMMUNITY)
Admission: RE | Admit: 2022-01-01 | Discharge: 2022-01-01 | Disposition: A | Discharge status: Home / Self Care | Payer: Medicaid Other | Source: Ambulatory Visit

## 2022-01-01 VITALS — BP 133/82 | HR 101 | Wt 236.0 lb

## 2022-01-01 DIAGNOSIS — O10919 Unspecified pre-existing hypertension complicating pregnancy, unspecified trimester: Secondary | ICD-10-CM

## 2022-01-01 DIAGNOSIS — O3663X0 Maternal care for excessive fetal growth, third trimester, not applicable or unspecified: Secondary | ICD-10-CM

## 2022-01-01 DIAGNOSIS — O0990 Supervision of high risk pregnancy, unspecified, unspecified trimester: Secondary | ICD-10-CM | POA: Insufficient documentation

## 2022-01-01 DIAGNOSIS — A6000 Herpesviral infection of urogenital system, unspecified: Secondary | ICD-10-CM

## 2022-01-01 DIAGNOSIS — Z3A36 36 weeks gestation of pregnancy: Secondary | ICD-10-CM

## 2022-01-01 LAB — OB RESULTS CONSOLE GBS: GBS: POSITIVE

## 2022-01-01 NOTE — Progress Notes (Signed)
   PRENATAL VISIT NOTE  Subjective:  Amber Dennis is a 26 y.o. [id number] at 20w2dbeing seen today for ongoing prenatal care.  She is currently monitored for the following issues for this high-risk pregnancy and has Supervision of high risk pregnancy, antepartum; COVID-19 affecting pregnancy, antepartum; Genital herpes; Chronic hypertension affecting pregnancy; Migraine with aura and without status migrainosus, not intractable; Not immune to rubella; and Chronic mental illness on their problem list.  Patient reports increased swelling in hands, face, and feet. She reports she wears compression socks on legs without much relief. She reports that her vision feels "strained". She denies headaches or RUQ/epigastric pain. She is also reporting increase in nausea and vomiting as well as sleep apnea. Diagnosed with "cyst on uvula" and is still taking antibiotics. She was told to take a cocktail of sudafed and benadryl which help for one night, but is no longer helping.  Contractions: Irregular. Vag. Bleeding: None.  Movement: Present. Denies leaking of fluid.   The following portions of the patient's history were reviewed and updated as appropriate: allergies, current medications, past family history, past medical history, past social history, past surgical history and problem list.   Objective:   Vitals:   01/01/22 1045  BP: 133/82  Pulse: (!) 101  Weight: 236 lb (107 kg)    Fetal Status: Fetal Heart Rate (bpm): 147   Movement: Present     General:  Alert, oriented and cooperative. Patient is in no acute distress.  Skin: Skin is warm and dry. No rash noted.   Cardiovascular: Normal heart rate noted  Respiratory: Normal respiratory effort, no problems with respiration noted  Abdomen: Soft, gravid, appropriate for gestational age.  Pain/Pressure: Absent     Pelvic: Cervical exam deferred        Extremities: Normal range of motion.  Edema: Trace  Mental Status: Normal mood and affect. Normal behavior.  Normal judgment and thought content.   Assessment and Plan:  Pregnancy: [id number] 355w2d. Supervision of high risk pregnancy, antepartum - Routine OB  - Cultures today - Will reach out to MFM regarding delivery recommendations at 37 weeks vs 38 weeks given cHTN, LGA, and mild poly noted on yesterday's ultrasound   - Culture, beta strep (group b only) - Cervicovaginal ancillary only( [place])  2. [redacted] weeks gestation of pregnancy - Active fetal movement  3. Chronic hypertension affecting pregnancy - No meds. bASA - Normotensive today - Patient reports increase in swelling and "vision straining". Denies headache, RUQ/epigastric pain. Will repeat labs today - Pre-e precautions given  - CBC - Comp Met (CMET) - Protein / creatinine ratio, urine  4. Genital herpes simplex, unspecified site - On ppx  5. Excessive fetal growth affecting management of pregnancy in third trimester, single or unspecified fetus - Growth USKorean 7/6 EFW 4172g, >99% - Pelvis proven to 4082g   Preterm labor symptoms and general obstetric precautions including but not limited to vaginal bleeding, contractions, leaking of fluid and fetal movement were reviewed in detail with the patient. Please refer to After Visit Summary for other counseling recommendations.   Return in about 1 week (around 01/08/2022).  Future Appointments  Date Time Provider [place]7/14/2023 11:10 AM BhJulianne HandlerCNM [facility]  01/15/2022 10:10 AM BhJulianne HandlerCNM [facility]  2:50 PM DuRadene GunningMD [facility]08/[facility]21  2:30 PM DuRadene GunningMD [facility]

## 2022-01-02 LAB — PROTEIN / CREATININE RATIO, URINE
Creatinine, Urine: 102 mg/dL (ref 20–275)
Protein/Creat Ratio: 167 mg/g creat (ref 24–184)
Protein/Creatinine Ratio: 0.167 mg/mg creat (ref 0.024–0.184)
Total Protein, Urine: 17 mg/dL (ref 5–24)

## 2022-01-02 LAB — COMPREHENSIVE METABOLIC PANEL
AG Ratio: 1.4 (calc) (ref 1.0–2.5)
ALT: 13 U/L (ref 6–29)
AST: 14 U/L (ref 10–30)
Albumin: 3.3 g/dL — ABNORMAL LOW (ref 3.6–5.1)
Alkaline phosphatase (APISO): 83 U/L (ref 31–125)
BUN/Creatinine Ratio: 17 (calc) (ref 6–22)
BUN: 8 mg/dL (ref 7–25)
CO2: 22 mmol/L (ref 20–32)
Calcium: 8.6 mg/dL (ref 8.6–10.2)
Chloride: 107 mmol/L (ref 98–110)
Creat: 0.48 mg/dL — ABNORMAL LOW (ref 0.50–0.96)
Globulin: 2.3 g/dL (calc) (ref 1.9–3.7)
Glucose, Bld: 83 mg/dL (ref 65–139)
Potassium: 4.2 mmol/L (ref 3.5–5.3)
Sodium: 138 mmol/L (ref 135–146)
Total Bilirubin: 0.3 mg/dL (ref 0.2–1.2)
Total Protein: 5.6 g/dL — ABNORMAL LOW (ref 6.1–8.1)

## 2022-01-02 LAB — CBC
HCT: 32.2 % — ABNORMAL LOW (ref 35.0–45.0)
Hemoglobin: 10.2 g/dL — ABNORMAL LOW (ref 11.7–15.5)
MCH: 25.5 pg — ABNORMAL LOW (ref 27.0–33.0)
MCHC: 31.7 g/dL — ABNORMAL LOW (ref 32.0–36.0)
MCV: 80.5 fL (ref 80.0–100.0)
MPV: 11.9 fL (ref 7.5–12.5)
Platelets: 209 10*3/uL (ref 140–400)
RBC: 4 10*6/uL (ref 3.80–5.10)
RDW: 14 % (ref 11.0–15.0)
WBC: 11.5 10*3/uL — ABNORMAL HIGH (ref 3.8–10.8)

## 2022-01-03 LAB — CULTURE, BETA STREP (GROUP B ONLY)
MICRO NUMBER:: 13617832
SPECIMEN QUALITY:: ADEQUATE

## 2022-01-04 ENCOUNTER — Other Ambulatory Visit: Payer: Self-pay | Admitting: Advanced Practice Midwife

## 2022-01-04 ENCOUNTER — Encounter (HOSPITAL_COMMUNITY): Payer: Self-pay | Admitting: Obstetrics and Gynecology

## 2022-01-04 ENCOUNTER — Inpatient Hospital Stay (HOSPITAL_COMMUNITY)
Admission: AD | Admit: 2022-01-04 | Discharge: 2022-01-08 | DRG: 806 | Disposition: A | Discharge status: Home / Self Care | Payer: Medicaid Other | Attending: Obstetrics & Gynecology | Admitting: Obstetrics & Gynecology

## 2022-01-04 DIAGNOSIS — F429 Obsessive-compulsive disorder, unspecified: Secondary | ICD-10-CM | POA: Diagnosis present

## 2022-01-04 DIAGNOSIS — A6009 Herpesviral infection of other urogenital tract: Secondary | ICD-10-CM | POA: Diagnosis present

## 2022-01-04 DIAGNOSIS — F419 Anxiety disorder, unspecified: Secondary | ICD-10-CM | POA: Diagnosis present

## 2022-01-04 DIAGNOSIS — O10919 Unspecified pre-existing hypertension complicating pregnancy, unspecified trimester: Secondary | ICD-10-CM

## 2022-01-04 DIAGNOSIS — O99344 Other mental disorders complicating childbirth: Secondary | ICD-10-CM | POA: Diagnosis present

## 2022-01-04 DIAGNOSIS — Z789 Other specified health status: Secondary | ICD-10-CM | POA: Diagnosis present

## 2022-01-04 DIAGNOSIS — O1410 Severe pre-eclampsia, unspecified trimester: Secondary | ICD-10-CM

## 2022-01-04 DIAGNOSIS — Z23 Encounter for immunization: Secondary | ICD-10-CM

## 2022-01-04 DIAGNOSIS — Z3A37 37 weeks gestation of pregnancy: Secondary | ICD-10-CM

## 2022-01-04 DIAGNOSIS — Z8249 Family history of ischemic heart disease and other diseases of the circulatory system: Secondary | ICD-10-CM | POA: Diagnosis not present

## 2022-01-04 DIAGNOSIS — Z818 Family history of other mental and behavioral disorders: Secondary | ICD-10-CM

## 2022-01-04 DIAGNOSIS — O0990 Supervision of high risk pregnancy, unspecified, unspecified trimester: Principal | ICD-10-CM

## 2022-01-04 DIAGNOSIS — O9832 Other infections with a predominantly sexual mode of transmission complicating childbirth: Secondary | ICD-10-CM | POA: Diagnosis present

## 2022-01-04 DIAGNOSIS — A6000 Herpesviral infection of urogenital system, unspecified: Secondary | ICD-10-CM | POA: Diagnosis present

## 2022-01-04 DIAGNOSIS — F319 Bipolar disorder, unspecified: Secondary | ICD-10-CM | POA: Diagnosis present

## 2022-01-04 DIAGNOSIS — O1414 Severe pre-eclampsia complicating childbirth: Secondary | ICD-10-CM | POA: Diagnosis not present

## 2022-01-04 DIAGNOSIS — Z3A36 36 weeks gestation of pregnancy: Secondary | ICD-10-CM | POA: Diagnosis not present

## 2022-01-04 DIAGNOSIS — O4202 Full-term premature rupture of membranes, onset of labor within 24 hours of rupture: Secondary | ICD-10-CM | POA: Diagnosis not present

## 2022-01-04 DIAGNOSIS — Z886 Allergy status to analgesic agent status: Secondary | ICD-10-CM

## 2022-01-04 DIAGNOSIS — O114 Pre-existing hypertension with pre-eclampsia, complicating childbirth: Principal | ICD-10-CM | POA: Diagnosis present

## 2022-01-04 DIAGNOSIS — Z79899 Other long term (current) drug therapy: Secondary | ICD-10-CM

## 2022-01-04 DIAGNOSIS — O113 Pre-existing hypertension with pre-eclampsia, third trimester: Secondary | ICD-10-CM | POA: Diagnosis present

## 2022-01-04 DIAGNOSIS — O99820 Streptococcus B carrier state complicating pregnancy: Secondary | ICD-10-CM | POA: Diagnosis not present

## 2022-01-04 DIAGNOSIS — O99824 Streptococcus B carrier state complicating childbirth: Secondary | ICD-10-CM | POA: Diagnosis present

## 2022-01-04 LAB — CBC
HCT: 33 % — ABNORMAL LOW (ref 36.0–46.0)
Hemoglobin: 10.5 g/dL — ABNORMAL LOW (ref 12.0–15.0)
MCH: 25.5 pg — ABNORMAL LOW (ref 26.0–34.0)
MCHC: 31.8 g/dL (ref 30.0–36.0)
MCV: 80.1 fL (ref 80.0–100.0)
Platelets: 210 10*3/uL (ref 150–400)
RBC: 4.12 MIL/uL (ref 3.87–5.11)
RDW: 14.5 % (ref 11.5–15.5)
WBC: 13.4 10*3/uL — ABNORMAL HIGH (ref 4.0–10.5)
nRBC: 0 % (ref 0.0–0.2)

## 2022-01-04 LAB — COMPREHENSIVE METABOLIC PANEL
ALT: 18 U/L (ref 0–44)
AST: 21 U/L (ref 15–41)
Albumin: 2.6 g/dL — ABNORMAL LOW (ref 3.5–5.0)
Alkaline Phosphatase: 78 U/L (ref 38–126)
Anion gap: 10 (ref 5–15)
BUN: 10 mg/dL (ref 6–20)
CO2: 20 mmol/L — ABNORMAL LOW (ref 22–32)
Calcium: 8.7 mg/dL — ABNORMAL LOW (ref 8.9–10.3)
Chloride: 110 mmol/L (ref 98–111)
Creatinine, Ser: 0.65 mg/dL (ref 0.44–1.00)
GFR, Estimated: >60 mL/min (ref >60)
Glucose, Bld: 88 mg/dL (ref 70–99)
Potassium: 4 mmol/L (ref 3.5–5.1)
Sodium: 140 mmol/L (ref 135–145)
Total Bilirubin: 0.4 mg/dL (ref 0.3–1.2)
Total Protein: 5.7 g/dL — ABNORMAL LOW (ref 6.5–8.1)

## 2022-01-04 LAB — URINALYSIS, ROUTINE W REFLEX MICROSCOPIC
Bilirubin Urine: NEGATIVE
Glucose, UA: NEGATIVE mg/dL
Hgb urine dipstick: NEGATIVE
Ketones, ur: NEGATIVE mg/dL
Leukocytes,Ua: NEGATIVE
Nitrite: NEGATIVE
Protein, ur: 30 mg/dL — AB
Specific Gravity, Urine: 1.025 (ref 1.005–1.030)
pH: 6 (ref 5.0–8.0)

## 2022-01-04 LAB — PROTEIN / CREATININE RATIO, URINE
Creatinine, Urine: 161.28 mg/dL
Protein Creatinine Ratio: 0.14 mg/mg{Cre} (ref 0.00–0.15)
Total Protein, Urine: 23 mg/dL

## 2022-01-04 LAB — TYPE AND SCREEN
ABO/RH(D): B POS
Antibody Screen: NEGATIVE

## 2022-01-04 LAB — BRAIN NATRIURETIC PEPTIDE: B Natriuretic Peptide: 73.7 pg/mL (ref 0.0–100.0)

## 2022-01-04 MED ORDER — OXYTOCIN BOLUS FROM INFUSION
333.0000 mL | Freq: Once | INTRAVENOUS | Status: AC
  Administered 2022-01-06: 333 mL via INTRAVENOUS

## 2022-01-04 MED ORDER — OXYTOCIN-SODIUM CHLORIDE 30-0.9 UT/500ML-% IV SOLN: 2.5000 [IU]/h | INTRAVENOUS | Status: DC

## 2022-01-04 MED ORDER — ONDANSETRON HCL 4 MG/2ML IJ SOLN: 4.0000 mg | Freq: Four times a day (QID) | INTRAMUSCULAR | Status: DC | PRN

## 2022-01-04 MED ORDER — TERBUTALINE SULFATE 1 MG/ML IJ SOLN: 0.2500 mg | Freq: Once | INTRAMUSCULAR | Status: DC | PRN

## 2022-01-04 MED ORDER — LACTATED RINGERS IV SOLN
500.0000 mL | INTRAVENOUS | Status: DC | PRN
  Administered 2022-01-06: 500 mL via INTRAVENOUS

## 2022-01-04 MED ORDER — OXYCODONE-ACETAMINOPHEN 5-325 MG PO TABS: 2.0000 | ORAL_TABLET | ORAL | Status: DC | PRN

## 2022-01-04 MED ORDER — MAGNESIUM SULFATE BOLUS VIA INFUSION
4.0000 g | Freq: Once | INTRAVENOUS | Status: AC
  Administered 2022-01-04: 4 g via INTRAVENOUS
  Filled 2022-01-04: qty 1000

## 2022-01-04 MED ORDER — LABETALOL HCL 200 MG PO TABS
300.0000 mg | ORAL_TABLET | Freq: Two times a day (BID) | ORAL | Status: DC
  Administered 2022-01-04: 300 mg via ORAL
  Filled 2022-01-04: qty 1

## 2022-01-04 MED ORDER — SODIUM CHLORIDE 0.9 % IV SOLN
5.0000 10*6.[IU] | Freq: Once | INTRAVENOUS | Status: AC
  Administered 2022-01-04: 5 10*6.[IU] via INTRAVENOUS
  Filled 2022-01-04: qty 5

## 2022-01-04 MED ORDER — FENTANYL CITRATE (PF) 100 MCG/2ML IJ SOLN
100.0000 ug | INTRAMUSCULAR | Status: DC | PRN
  Administered 2022-01-05: 100 ug via INTRAVENOUS
  Filled 2022-01-04: qty 2

## 2022-01-04 MED ORDER — LACTATED RINGERS IV SOLN: INTRAVENOUS | Status: DC

## 2022-01-04 MED ORDER — LIDOCAINE HCL (PF) 1 % IJ SOLN: 30.0000 mL | INTRAMUSCULAR | Status: DC | PRN

## 2022-01-04 MED ORDER — SOD CITRATE-CITRIC ACID 500-334 MG/5ML PO SOLN: 30.0000 mL | ORAL | Status: DC | PRN

## 2022-01-04 MED ORDER — MISOPROSTOL 50MCG HALF TABLET
50.0000 ug | ORAL_TABLET | ORAL | Status: DC | PRN
  Administered 2022-01-04 – 2022-01-05 (×2): 50 ug via BUCCAL
  Filled 2022-01-04 (×2): qty 1

## 2022-01-04 MED ORDER — HYDRALAZINE HCL 20 MG/ML IJ SOLN: 10.0000 mg | INTRAMUSCULAR | Status: DC | PRN

## 2022-01-04 MED ORDER — MAGNESIUM SULFATE 40 GM/1000ML IV SOLN
2.0000 g/h | INTRAVENOUS | Status: AC
  Administered 2022-01-05 – 2022-01-06 (×2): 2 g/h via INTRAVENOUS
  Filled 2022-01-04 (×3): qty 1000

## 2022-01-04 MED ORDER — OXYCODONE-ACETAMINOPHEN 5-325 MG PO TABS: 1.0000 | ORAL_TABLET | ORAL | Status: DC | PRN

## 2022-01-04 MED ORDER — OXYTOCIN-SODIUM CHLORIDE 30-0.9 UT/500ML-% IV SOLN
2.5000 [IU]/h | INTRAVENOUS | Status: DC
  Administered 2022-01-06: 2.5 [IU]/h via INTRAVENOUS

## 2022-01-04 MED ORDER — ACETAMINOPHEN 325 MG PO TABS
650.0000 mg | ORAL_TABLET | ORAL | Status: DC | PRN
  Administered 2022-01-04: 650 mg via ORAL
  Filled 2022-01-04: qty 2

## 2022-01-04 MED ORDER — LABETALOL HCL 5 MG/ML IV SOLN: 20.0000 mg | INTRAVENOUS | Status: DC | PRN

## 2022-01-04 MED ORDER — ZOLPIDEM TARTRATE 5 MG PO TABS
5.0000 mg | ORAL_TABLET | Freq: Every evening | ORAL | Status: DC | PRN
  Administered 2022-01-04: 5 mg via ORAL
  Filled 2022-01-04: qty 1

## 2022-01-04 MED ORDER — CAFFEINE 200 MG PO TABS
200.0000 mg | ORAL_TABLET | Freq: Once | ORAL | Status: AC
Start: 2022-01-04 — End: 2022-01-04
  Administered 2022-01-04: 200 mg via ORAL
  Filled 2022-01-04: qty 1

## 2022-01-04 MED ORDER — LABETALOL HCL 5 MG/ML IV SOLN: 80.0000 mg | INTRAVENOUS | Status: DC | PRN

## 2022-01-04 MED ORDER — LABETALOL HCL 5 MG/ML IV SOLN: 40.0000 mg | INTRAVENOUS | Status: DC | PRN

## 2022-01-04 MED ORDER — PENICILLIN G POT IN DEXTROSE 60000 UNIT/ML IV SOLN
3.0000 10*6.[IU] | INTRAVENOUS | Status: DC
  Administered 2022-01-05 – 2022-01-06 (×6): 3 10*6.[IU] via INTRAVENOUS
  Filled 2022-01-04 (×7): qty 50

## 2022-01-04 MED ORDER — LACTATED RINGERS IV SOLN: 500.0000 mL | INTRAVENOUS | Status: DC | PRN

## 2022-01-04 MED ORDER — OXYTOCIN BOLUS FROM INFUSION: 333.0000 mL | Freq: Once | INTRAVENOUS | Status: DC

## 2022-01-04 NOTE — Progress Notes (Unsigned)
Pt contacted CNM via MyChart and reviewed blood pressures at home ranging from 140s/90s to 150s/100s.  Pt with increased swelling and shortness of breath, making it difficult to sleep.  Given CHTN with worsening symptoms and no need for medications until last 2-3 weeks, it is reasonable to induce after 37 weeks.  Orders placed and paperwork filled out for IOL at 37 weeks to be scheduled with L&D.

## 2022-01-04 NOTE — MAU Note (Addendum)
Pt says on Sat BP- was 158/95 Yesterday- 160/90's Today 152/109 H/A started Sat - took Tyl XS at 2pm - 2 tabs- with no relief- pain- 6 Vision- yesterday at 430- blurred vision- now fuzzy  Had pain under right breast at 1 pm- lasted 30 min  [facility] . Called today about induction - talked to Deanna - told to come here .Messaged Lisa, CNM- said was putting in orders for induction for 37 weeks   Denies UC's

## 2022-01-04 NOTE — Plan of Care (Signed)
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
## 2022-01-05 ENCOUNTER — Other Ambulatory Visit: Payer: Self-pay

## 2022-01-05 ENCOUNTER — Inpatient Hospital Stay (HOSPITAL_COMMUNITY): Payer: Medicaid Other | Admitting: Anesthesiology

## 2022-01-05 LAB — CBC
HCT: 33.6 % — ABNORMAL LOW (ref 36.0–46.0)
HCT: 31 % — ABNORMAL LOW (ref 36.0–46.0)
Hemoglobin: 10.6 g/dL — ABNORMAL LOW (ref 12.0–15.0)
Hemoglobin: 9.7 g/dL — ABNORMAL LOW (ref 12.0–15.0)
MCH: 25.4 pg — ABNORMAL LOW (ref 26.0–34.0)
MCH: 25 pg — ABNORMAL LOW (ref 26.0–34.0)
MCHC: 31.5 g/dL (ref 30.0–36.0)
MCHC: 31.3 g/dL (ref 30.0–36.0)
MCV: 80.4 fL (ref 80.0–100.0)
MCV: 79.9 fL — ABNORMAL LOW (ref 80.0–100.0)
Platelets: 215 10*3/uL (ref 150–400)
Platelets: 204 10*3/uL (ref 150–400)
RBC: 4.18 MIL/uL (ref 3.87–5.11)
RBC: 3.88 MIL/uL (ref 3.87–5.11)
RDW: 14.7 % (ref 11.5–15.5)
RDW: 14.6 % (ref 11.5–15.5)
WBC: 14.1 10*3/uL — ABNORMAL HIGH (ref 4.0–10.5)
WBC: 15.2 10*3/uL — ABNORMAL HIGH (ref 4.0–10.5)
nRBC: 0 % (ref 0.0–0.2)
nRBC: 0 % (ref 0.0–0.2)

## 2022-01-05 LAB — CERVICOVAGINAL ANCILLARY ONLY
Chlamydia: NEGATIVE
Comment: NEGATIVE
Comment: NORMAL
Neisseria Gonorrhea: NEGATIVE

## 2022-01-05 LAB — RPR: RPR Ser Ql: NONREACTIVE

## 2022-01-05 MED ORDER — EPHEDRINE 5 MG/ML INJ
10.0000 mg | INTRAVENOUS | Status: DC | PRN
Start: 2022-01-05 — End: 2022-01-06

## 2022-01-05 MED ORDER — DIPHENHYDRAMINE HCL 50 MG/ML IJ SOLN
12.5000 mg | INTRAMUSCULAR | Status: DC | PRN
  Administered 2022-01-06: 25 mg via INTRAVENOUS
  Filled 2022-01-05: qty 1

## 2022-01-05 MED ORDER — FENTANYL-BUPIVACAINE-NACL 0.5-0.125-0.9 MG/250ML-% EP SOLN
12.0000 mL/h | EPIDURAL | Status: DC | PRN
  Administered 2022-01-05 – 2022-01-06 (×2): 12 mL/h via EPIDURAL
  Filled 2022-01-05 (×2): qty 250

## 2022-01-05 MED ORDER — LABETALOL HCL 100 MG PO TABS
100.0000 mg | ORAL_TABLET | Freq: Two times a day (BID) | ORAL | Status: DC
  Administered 2022-01-05 – 2022-01-06 (×4): 100 mg via ORAL
  Filled 2022-01-05 (×4): qty 1

## 2022-01-05 MED ORDER — FENTANYL-BUPIVACAINE-NACL 0.5-0.125-0.9 MG/250ML-% EP SOLN: 12.0000 mL/h | EPIDURAL | Status: DC | PRN

## 2022-01-05 MED ORDER — PHENYLEPHRINE 80 MCG/ML (10ML) SYRINGE FOR IV PUSH (FOR BLOOD PRESSURE SUPPORT): 80.0000 ug | PREFILLED_SYRINGE | INTRAVENOUS | Status: DC | PRN

## 2022-01-05 MED ORDER — CALCIUM CARBONATE ANTACID 500 MG PO CHEW
1.0000 | CHEWABLE_TABLET | Freq: Two times a day (BID) | ORAL | Status: DC
  Administered 2022-01-05 – 2022-01-08 (×7): 200 mg via ORAL
  Filled 2022-01-05 (×7): qty 1

## 2022-01-05 MED ORDER — LACTATED RINGERS IV SOLN
500.0000 mL | Freq: Once | INTRAVENOUS | Status: AC
  Administered 2022-01-05: 500 mL via INTRAVENOUS

## 2022-01-05 MED ORDER — DIPHENHYDRAMINE HCL 50 MG/ML IJ SOLN: 12.5000 mg | INTRAMUSCULAR | Status: DC | PRN

## 2022-01-05 MED ORDER — LIDOCAINE HCL (PF) 1 % IJ SOLN
INTRAMUSCULAR | Status: DC | PRN
  Administered 2022-01-05 (×2): 8 mL via EPIDURAL

## 2022-01-05 MED ORDER — EPHEDRINE 5 MG/ML INJ: 10.0000 mg | INTRAVENOUS | Status: DC | PRN

## 2022-01-05 MED ORDER — OXYTOCIN-SODIUM CHLORIDE 30-0.9 UT/500ML-% IV SOLN
1.0000 m[IU]/min | INTRAVENOUS | Status: DC
  Administered 2022-01-05: 2 m[IU]/min via INTRAVENOUS
  Filled 2022-01-05: qty 500

## 2022-01-05 MED ORDER — LACTATED RINGERS IV SOLN: 500.0000 mL | Freq: Once | INTRAVENOUS | Status: DC

## 2022-01-05 NOTE — Anesthesia Preprocedure Evaluation (Signed)
Anesthesia Evaluation  Patient identified by MRN, date of birth, ID band Patient awake    Reviewed: Allergy & Precautions, NPO status , Patient's Chart, lab work & pertinent test results  Airway Mallampati: III  TM Distance: >3 FB Neck ROM: Full    Dental no notable dental hx.    Pulmonary neg pulmonary ROS,    Pulmonary exam normal breath sounds clear to auscultation       Cardiovascular hypertension (preeclampsia on Mag), Pt. on medications Normal cardiovascular exam Rhythm:Regular Rate:Normal     Neuro/Psych  Headaches, PSYCHIATRIC DISORDERS Anxiety Depression Bipolar Disorder    GI/Hepatic negative GI ROS, Neg liver ROS,   Endo/Other  negative endocrine ROS  Renal/GU negative Renal ROS  negative genitourinary   Musculoskeletal negative musculoskeletal ROS (+)   Abdominal   Peds negative pediatric ROS (+)  Hematology negative hematology ROS (+)   Anesthesia Other Findings   Reproductive/Obstetrics (+) Pregnancy                             Anesthesia Physical Anesthesia Plan  ASA: 3  Anesthesia Plan: Epidural   Post-op Pain Management: Minimal or no pain anticipated   Induction:   PONV Risk Score and Plan: 2 and Treatment may vary due to age or medical condition  Airway Management Planned: Natural Airway  Additional Equipment: None  Intra-op Plan:   Post-operative Plan:   Informed Consent: I have reviewed the patients History and Physical, chart, labs and discussed the procedure including the risks, benefits and alternatives for the proposed anesthesia with the patient or authorized representative who has indicated his/her understanding and acceptance.       Plan Discussed with: Anesthesiologist  Anesthesia Plan Comments:         Anesthesia Quick Evaluation

## 2022-01-05 NOTE — Progress Notes (Signed)
Amber Dennis is a 26 y.o. [id number] at [redacted]w[redacted]d admitted for induction of labor due to Hypertension.  Subjective: Coping well with contractions. No questions or concerns at this time.   Objective: BP 134/84   Pulse (!) 101   Temp 98.2 F (36.8 C) (Oral)   Resp 18   Ht 5\' 6"  (1.676 m)   Wt 107.4 kg   LMP 04/22/2021   SpO2 96%   BMI 38.20 kg/m  I/O last 3 completed shifts: In: 1744.6 [I.V.:1744.6] Out: 2050 [Urine:2050] Total I/O In: 420 [P.O.:120; I.V.:250; IV Piggyback:50] Out: 250 [Urine:250]  FHT:  FHR: 135 bpm, variability: moderate,  accelerations:  Present,  decelerations:  Absent UC:   regular, every 4-5 minutes SVE:   Dilation: 2 (external os) Exam by:: [email or web address], CNM  Labs: Lab Results  Component Value Date   WBC 14.1 (H) 01/04/2022   HGB 10.6 (L) 01/04/2022   HCT 33.6 (L) 01/04/2022   MCV 80.4 01/04/2022   PLT 215 01/04/2022    Assessment / Plan: IOL 2/2 CHTN with SIPE with severe features (HA) Has had cytotec and FB. FB is out now.  Patient breathing through contractions and would like to see if she progresses some on her own prior to starting pitocin. Will continue to monitor and will consider Pitocin or AROM if needed.  Will decrease labetalol dose to 100mg  BID due to hypotension over night.   Labor: Progressing normally Preeclampsia:  on magnesium sulfate Fetal Wellbeing:  Category I Pain Control:  Labor support without medications I/D:  n/a Anticipated MOD:  NSVD  03/07/2022 DNP, CNM  01/05/22  9:43 AM

## 2022-01-05 NOTE — Progress Notes (Signed)
Amber Dennis is a 26 y.o. [id number] at [redacted]w[redacted]d admitted for induction of labor due to Hypertension.  Subjective:  Coping well. Pitocin started around 12:45.   Objective: BP 131/80   Pulse (!) 101   Temp 98.2 F (36.8 C) (Oral)   Resp 16   Ht 5\' 6"  (1.676 m)   Wt 107.4 kg   LMP 04/22/2021   SpO2 96%   BMI 38.20 kg/m  I/O last 3 completed shifts: In: 1744.6 [I.V.:1744.6] Out: 2050 [Urine:2050] Total I/O In: 850 [P.O.:300; I.V.:500; IV Piggyback:50] Out: 650 [Urine:650]  FHT:  FHR: 135 bpm, variability: moderate,  accelerations:  Present,  decelerations:  Absent UC:   regular, every 3-5 minutes SVE:   Dilation: 5 Effacement (%): 70 Station: -3 Exam by:: [email or web address], RN  Labs: Lab Results  Component Value Date   WBC 14.1 (H) 01/04/2022   HGB 10.6 (L) 01/04/2022   HCT 33.6 (L) 01/04/2022   MCV 80.4 01/04/2022   PLT 215 01/04/2022    Assessment / Plan: Induction of labor due to preeclampsia and CHTN,  progressing well on pitocin  Labor: Progressing on Pitocin, will continue to increase then AROM Preeclampsia:  on magnesium sulfate Fetal Wellbeing:  Category I Pain Control:  Labor support without medications I/D:  n/a Anticipated MOD:  NSVD   03/07/2022 DNP, CNM  01/05/22  3:03 PM

## 2022-01-05 NOTE — Anesthesia Procedure Notes (Signed)
Epidural Patient location during procedure: OB Start time: 01/05/2022 6:20 PM End time: 01/05/2022 6:30 PM  Staffing Anesthesiologist: Mellody Dance, MD Performed: anesthesiologist   Preanesthetic Checklist Completed: patient identified, IV checked, site marked, risks and benefits discussed, monitors and equipment checked, pre-op evaluation and timeout performed  Epidural Patient position: sitting Prep: DuraPrep Patient monitoring: heart rate, cardiac monitor, continuous pulse ox and blood pressure Approach: midline Location: L2-L3 Injection technique: LOR saline  Needle:  Needle type: Tuohy  Needle gauge: 17 G Needle length: 9 cm Needle insertion depth: 5 cm Catheter type: closed end flexible Catheter size: 20 Guage Catheter at skin depth: 10 cm Test dose: negative and Other  Assessment Events: blood not aspirated, injection not painful, no injection resistance and negative IV test  Additional Notes Informed consent obtained prior to proceeding including risk of failure, 1% risk of PDPH, risk of minor discomfort and bruising.  Discussed rare but serious complications including epidural abscess, permanent nerve injury, epidural hematoma.  Discussed alternatives to epidural analgesia and patient desires to proceed.  Timeout performed pre-procedure verifying patient name, procedure, and platelet count.  Patient tolerated procedure well.

## 2022-01-05 NOTE — Progress Notes (Signed)
Labor Progress Note Amber Dennis is a 26 y.o. [phone] at [redacted]w[redacted]d presented for IOL for cHTN with SIPE with SF  S:  Patient feeling relief after second epidural. Ok with check now  O:  BP (!) 124/58   Pulse (!) 102   Temp 97.9 F (36.6 C) (Oral)   Resp 16   Ht 5\' 6"  (1.676 m)   Wt 107.4 kg   LMP 04/22/2021   SpO2 96%   BMI 38.20 kg/m  EFM: 130/moderate variability/+accels/no decels  CVE: Dilation: 6 Effacement (%): 90 Cervical Position: Posterior Station: Ballotable Presentation: Vertex Exam by:: Dr. [email or web address]   A&P: 26 y.o. 22 at [redacted]w[redacted]d presented for IOL for cHTN with SIPE with SF #Labor: cervix continues to be about 6 cm and 80-90% effaced. Bulging bag and unable to feel presenting part. BSUS done to confirm vertex. Hold of on AROM at this time as head ballotable. Continue pitocin and uptitrate as appropriate. Will reassess in 2-3 hours for possible AROM. In the meantime will try positioning in order to facilitate fetal head descent. #Pain: epidural #FWB: Cat I #GBS positive   #Severe preE BP in 120s/70s-80s, Asymptomatic at this time. Continue to monitor. Continue Mg - recheck labs at 0500  [redacted]w[redacted]d, MD, MPH OB Fellow, Faculty Practice

## 2022-01-05 NOTE — Progress Notes (Signed)
Amber Dennis is a 26 y.o. [phone] at [redacted]w[redacted]d admitted for induction of labor due to Hypertension.  Subjective:  Just got epidural. Still feeling some pain with contractions, but it is getting better.   Objective: BP 119/66   Pulse 75   Temp 98.2 F (36.8 C) (Oral)   Resp 18   Ht 5\' 6"  (1.676 m)   Wt 107.4 kg   LMP 04/22/2021   SpO2 96%   BMI 38.20 kg/m  I/O last 3 completed shifts: In: 1744.6 [I.V.:1744.6] Out: 2050 [Urine:2050] Total I/O In: 850 [P.O.:300; I.V.:500; IV Piggyback:50] Out: 650 [Urine:650]  FHT:  FHR: 135 bpm, variability: moderate,  accelerations:  Present,  decelerations:  Absent UC:   regular, every 3-4 minutes SVE:  6/90/BBOW/-3   Fetal station is too high for AROM at this time.   Labs: Lab Results  Component Value Date   WBC 15.2 (H) 01/05/2022   HGB 9.7 (L) 01/05/2022   HCT 31.0 (L) 01/05/2022   MCV 79.9 (L) 01/05/2022   PLT 204 01/05/2022    Assessment / Plan: Induction of labor due to preeclampsia,  progressing well on pitocin  Labor: Progressing on Pitocin, will continue to increase then AROM Preeclampsia:  on magnesium sulfate Fetal Wellbeing:  Category I Pain Control:  Epidural I/D:  n/a Anticipated MOD:  NSVD   03/08/2022 DNP, CNM  01/05/22  7:02 PM

## 2022-01-05 NOTE — Progress Notes (Signed)
Amber Dennis, CNM discussed POC with patient and family. Pt would like to delay pitocin at this time to see if her body will progress on its own.  CNM in agreement with plan.

## 2022-01-06 ENCOUNTER — Encounter (HOSPITAL_COMMUNITY): Payer: Self-pay | Admitting: Family Medicine

## 2022-01-06 DIAGNOSIS — O1410 Severe pre-eclampsia, unspecified trimester: Secondary | ICD-10-CM

## 2022-01-06 DIAGNOSIS — O4202 Full-term premature rupture of membranes, onset of labor within 24 hours of rupture: Secondary | ICD-10-CM

## 2022-01-06 DIAGNOSIS — O1414 Severe pre-eclampsia complicating childbirth: Secondary | ICD-10-CM

## 2022-01-06 DIAGNOSIS — Z3A36 36 weeks gestation of pregnancy: Secondary | ICD-10-CM

## 2022-01-06 DIAGNOSIS — O99820 Streptococcus B carrier state complicating pregnancy: Secondary | ICD-10-CM

## 2022-01-06 DIAGNOSIS — O9832 Other infections with a predominantly sexual mode of transmission complicating childbirth: Secondary | ICD-10-CM

## 2022-01-06 LAB — CBC
HCT: 31.8 % — ABNORMAL LOW (ref 36.0–46.0)
Hemoglobin: 10.1 g/dL — ABNORMAL LOW (ref 12.0–15.0)
MCH: 25.2 pg — ABNORMAL LOW (ref 26.0–34.0)
MCHC: 31.8 g/dL (ref 30.0–36.0)
MCV: 79.3 fL — ABNORMAL LOW (ref 80.0–100.0)
Platelets: 214 10*3/uL (ref 150–400)
RBC: 4.01 MIL/uL (ref 3.87–5.11)
RDW: 14.7 % (ref 11.5–15.5)
WBC: 17.3 10*3/uL — ABNORMAL HIGH (ref 4.0–10.5)
nRBC: 0 % (ref 0.0–0.2)

## 2022-01-06 LAB — COMPREHENSIVE METABOLIC PANEL
ALT: 17 U/L (ref 0–44)
AST: 19 U/L (ref 15–41)
Albumin: 2.5 g/dL — ABNORMAL LOW (ref 3.5–5.0)
Alkaline Phosphatase: 82 U/L (ref 38–126)
Anion gap: 10 (ref 5–15)
BUN: 5 mg/dL — ABNORMAL LOW (ref 6–20)
CO2: 20 mmol/L — ABNORMAL LOW (ref 22–32)
Calcium: 7.5 mg/dL — ABNORMAL LOW (ref 8.9–10.3)
Chloride: 104 mmol/L (ref 98–111)
Creatinine, Ser: 0.7 mg/dL (ref 0.44–1.00)
GFR, Estimated: >60 mL/min (ref >60)
Glucose, Bld: 122 mg/dL — ABNORMAL HIGH (ref 70–99)
Potassium: 4 mmol/L (ref 3.5–5.1)
Sodium: 134 mmol/L — ABNORMAL LOW (ref 135–145)
Total Bilirubin: 0.4 mg/dL (ref 0.3–1.2)
Total Protein: 5.8 g/dL — ABNORMAL LOW (ref 6.5–8.1)

## 2022-01-06 LAB — MAGNESIUM: Magnesium: 5.7 mg/dL — ABNORMAL HIGH (ref 1.7–2.4)

## 2022-01-06 MED ORDER — OXYCODONE HCL 5 MG PO TABS: 10.0000 mg | ORAL_TABLET | ORAL | Status: DC | PRN

## 2022-01-06 MED ORDER — FUROSEMIDE 20 MG PO TABS
20.0000 mg | ORAL_TABLET | Freq: Every day | ORAL | Status: DC
Start: 2022-01-06 — End: 2022-01-08
  Administered 2022-01-06 – 2022-01-08 (×3): 20 mg via ORAL
  Filled 2022-01-06 (×3): qty 1

## 2022-01-06 MED ORDER — BENZOCAINE-MENTHOL 20-0.5 % EX AERO
1.0000 | INHALATION_SPRAY | CUTANEOUS | Status: DC | PRN
  Administered 2022-01-06: 1 via TOPICAL
  Filled 2022-01-06: qty 56

## 2022-01-06 MED ORDER — OXYCODONE HCL 5 MG PO TABS
5.0000 mg | ORAL_TABLET | ORAL | Status: DC | PRN
  Administered 2022-01-07 – 2022-01-08 (×3): 5 mg via ORAL
  Filled 2022-01-06 (×3): qty 1

## 2022-01-06 MED ORDER — BUPIVACAINE HCL (PF) 0.25 % IJ SOLN
INTRAMUSCULAR | Status: DC | PRN
  Administered 2022-01-06: 8 mL via EPIDURAL

## 2022-01-06 MED ORDER — DIBUCAINE (PERIANAL) 1 % EX OINT: 1.0000 | TOPICAL_OINTMENT | CUTANEOUS | Status: DC | PRN

## 2022-01-06 MED ORDER — ONDANSETRON HCL 4 MG PO TABS: 4.0000 mg | ORAL_TABLET | ORAL | Status: DC | PRN

## 2022-01-06 MED ORDER — COCONUT OIL OIL: 1.0000 | TOPICAL_OIL | Status: DC | PRN

## 2022-01-06 MED ORDER — SIMETHICONE 80 MG PO CHEW: 80.0000 mg | CHEWABLE_TABLET | ORAL | Status: DC | PRN

## 2022-01-06 MED ORDER — TRANEXAMIC ACID-NACL 1000-0.7 MG/100ML-% IV SOLN
INTRAVENOUS | Status: AC
  Administered 2022-01-06: 1000 mg
  Filled 2022-01-06: qty 100

## 2022-01-06 MED ORDER — ACETAMINOPHEN 325 MG PO TABS
650.0000 mg | ORAL_TABLET | ORAL | Status: DC | PRN
  Administered 2022-01-06 – 2022-01-07 (×2): 650 mg via ORAL
  Filled 2022-01-06 (×2): qty 2

## 2022-01-06 MED ORDER — WITCH HAZEL-GLYCERIN EX PADS: 1.0000 | MEDICATED_PAD | CUTANEOUS | Status: DC | PRN

## 2022-01-06 MED ORDER — ONDANSETRON HCL 4 MG/2ML IJ SOLN: 4.0000 mg | INTRAMUSCULAR | Status: DC | PRN

## 2022-01-06 MED ORDER — CYCLOBENZAPRINE HCL 5 MG PO TABS
5.0000 mg | ORAL_TABLET | Freq: Once | ORAL | Status: AC
  Administered 2022-01-06: 5 mg via ORAL
  Filled 2022-01-06: qty 1

## 2022-01-06 MED ORDER — MEDROXYPROGESTERONE ACETATE 150 MG/ML IM SUSP
150.0000 mg | INTRAMUSCULAR | Status: DC | PRN
Start: 2022-01-06 — End: 2022-01-08

## 2022-01-06 MED ORDER — TRANEXAMIC ACID-NACL 1000-0.7 MG/100ML-% IV SOLN
1000.0000 mg | INTRAVENOUS | Status: AC
Start: 2022-01-06 — End: 2022-01-06

## 2022-01-06 MED ORDER — NIFEDIPINE ER OSMOTIC RELEASE 30 MG PO TB24
30.0000 mg | ORAL_TABLET | Freq: Every day | ORAL | Status: DC
  Administered 2022-01-06: 30 mg via ORAL
  Filled 2022-01-06: qty 1

## 2022-01-06 MED ORDER — LACTATED RINGERS AMNIOINFUSION
INTRAVENOUS | Status: DC
  Filled 2022-01-06 (×2): qty 1000

## 2022-01-06 MED ORDER — PRENATAL MULTIVITAMIN CH
1.0000 | ORAL_TABLET | Freq: Every day | ORAL | Status: DC
  Administered 2022-01-06 – 2022-01-08 (×3): 1 via ORAL
  Filled 2022-01-06 (×3): qty 1

## 2022-01-06 MED ORDER — TETANUS-DIPHTH-ACELL PERTUSSIS 5-2.5-18.5 LF-MCG/0.5 IM SUSY: 0.5000 mL | PREFILLED_SYRINGE | Freq: Once | INTRAMUSCULAR | Status: DC

## 2022-01-06 MED ORDER — OXYTOCIN-SODIUM CHLORIDE 30-0.9 UT/500ML-% IV SOLN: 1.0000 m[IU]/min | INTRAVENOUS | Status: DC

## 2022-01-06 MED ORDER — MEASLES, MUMPS & RUBELLA VAC IJ SOLR
0.5000 mL | Freq: Once | INTRAMUSCULAR | Status: AC
  Administered 2022-01-08: 0.5 mL via SUBCUTANEOUS
  Filled 2022-01-06: qty 0.5

## 2022-01-06 MED ORDER — DIPHENHYDRAMINE HCL 25 MG PO CAPS: 25.0000 mg | ORAL_CAPSULE | Freq: Four times a day (QID) | ORAL | Status: DC | PRN

## 2022-01-06 MED ORDER — SENNOSIDES-DOCUSATE SODIUM 8.6-50 MG PO TABS
2.0000 | ORAL_TABLET | Freq: Every day | ORAL | Status: DC
  Administered 2022-01-07 – 2022-01-08 (×2): 2 via ORAL
  Filled 2022-01-06 (×2): qty 2

## 2022-01-06 NOTE — Anesthesia Postprocedure Evaluation (Signed)
Anesthesia Post Note  Patient: Amber Dennis  Procedure(s) Performed: AN AD HOC LABOR EPIDURAL     Patient location during evaluation: Mother Baby Anesthesia Type: Epidural Level of consciousness: awake and alert Pain management: pain level controlled Vital Signs Assessment: post-procedure vital signs reviewed and stable Respiratory status: spontaneous breathing, nonlabored ventilation and respiratory function stable Cardiovascular status: stable Postop Assessment: no headache, no backache and epidural receding Anesthetic complications: no   No notable events documented.  Last Vitals:  Vitals:   01/06/22 0853 01/06/22 0911  BP: (!) 155/91 (!) 149/84  Pulse: 97 (!) 101  Resp:  20  Temp:    SpO2:  100%    Last Pain:  Vitals:   01/06/22 0853  TempSrc:   PainSc: 0-No pain   Pain Goal: Patients Stated Pain Goal: 0 (01/06/22 0028)                 [facility] Records

## 2022-01-06 NOTE — Discharge Summary (Signed)
Postpartum Discharge Summary     Patient Name: Amber Dennis DOB: 15-Nov-1995 MRN: [id number]  Date of admission: 01/04/2022 Delivery date:01/06/2022  Delivering provider: Warner Mccreedy  Date of discharge: 01/08/2022  Admitting diagnosis: Chronic hypertension affecting pregnancy [O10.919] Intrauterine pregnancy: [redacted]w[redacted]d     Secondary diagnosis:  Principal Problem:   Chronic hypertension affecting pregnancy Active Problems:   Supervision of high risk pregnancy, antepartum   Genital herpes   Not immune to rubella   Vaginal delivery   Severe pre-eclampsia  Additional problems: None    Discharge diagnosis: Term Pregnancy Delivered and CHTN with superimposed preeclampsia                                              Augmentation: Pitocin, Cytotec, and IP Foley Complications: None  Hospital course: Induction of Labor With Vaginal Delivery   26 y.o. yo [phone] at [redacted]w[redacted]d was admitted to the hospital 01/04/2022 for induction of labor.  Indication for induction:  severe pre-eclampsia .  Patient had an uncomplicated labor course as follows: Membrane Rupture Time/Date: 12:28 AM ,01/06/2022   Delivery Method:Vaginal, Spontaneous  Episiotomy: None  Lacerations:  1st degree  Details of delivery can be found in separate delivery note.  Patient had a routine postpartum course. After completing magnesium sulfate infusion for 24 hours following delivery, she was started on procardia and lasix with good BP control. Patient is discharged home 01/08/22.  Newborn Data: Birth date:01/06/2022  Birth time:7:18 AM  Gender:Female  Living status:Living  Apgars:7 ,9  Weight:3650 g   Magnesium Sulfate received: Yes: Seizure prophylaxis BMZ received: No Rhophylac:N/A T-DaP:Given prenatally Flu: No   Physical exam  Vitals:   01/07/22 1958 01/07/22 2343 01/08/22 0351 01/08/22 0756  BP: 132/70 133/85 129/76 127/87  Pulse: (!) 101 96 99 92  Resp: 18 18 16 18   Temp: 98 F (36.7 C) 97.9 F (36.6 C) 97.8 F  (36.6 C) 97.6 F (36.4 C)  TempSrc: Oral Oral Oral Oral  SpO2: 100% 100% 100% 98%  Weight:      Height:       General: alert, cooperative, and no distress Lochia: appropriate Uterine Fundus: firm Incision: N/A DVT Evaluation: No evidence of DVT seen on physical exam. Labs: Lab Results  Component Value Date   WBC 17.5 (H) 01/07/2022   HGB 8.6 (L) 01/07/2022   HCT 27.9 (L) 01/07/2022   MCV 81.1 01/07/2022   PLT 193 01/07/2022      Latest Ref Rng & Units 01/06/2022    3:08 AM  CMP  Glucose 70 - 99 mg/dL 03/09/2022   BUN 6 - 20 mg/dL 5   Creatinine 169 - 6.78 mg/dL 9.38   Sodium 1.01 - 751 mmol/L 134   Potassium 3.5 - 5.1 mmol/L 4.0   Chloride 98 - 111 mmol/L 104   CO2 22 - 32 mmol/L 20   Calcium 8.9 - 10.3 mg/dL 7.5   Total Protein 6.5 - 8.1 g/dL 5.8   Total Bilirubin 0.3 - 1.2 mg/dL 0.4   Alkaline Phos 38 - 126 U/L 82   AST 15 - 41 U/L 19   ALT 0 - 44 U/L 17    Edinburgh Score:     No data to display           After visit meds:  Allergies as of 01/08/2022  Reactions   Ibuprofen Anaphylaxis, Itching   Itching in throat/breathing is tight   Aspirin Itching   Itchy throat         Medication List     STOP taking these medications    amoxicillin 875 MG tablet Commonly known as: AMOXIL       TAKE these medications    acetaminophen 325 MG tablet Commonly known as: Tylenol Take 2 tablets (650 mg total) by mouth every 4 (four) hours as needed (for pain scale < 4).   busPIRone 5 MG tablet Commonly known as: BUSPAR Take 1 tablet (5 mg total) by mouth 3 (three) times daily as needed. What changed: reasons to take this   calcium carbonate 500 MG chewable tablet Commonly known as: TUMS - dosed in mg elemental calcium Chew 500 mg by mouth at bedtime as needed for indigestion or heartburn.   ferrous gluconate 324 MG tablet Commonly known as: FERGON Take 1 tablet (324 mg total) by mouth every other day. Start taking on: January 09, 2022   furosemide  20 MG tablet Commonly known as: LASIX Take 1 tablet (20 mg total) by mouth daily. Start taking on: January 09, 2022   multivitamin-prenatal 27-0.8 MG Tabs tablet Take 1 tablet by mouth daily at 12 noon.   NIFEdipine 60 MG 24 hr tablet Commonly known as: ADALAT CC Take 1 tablet (60 mg total) by mouth every 12 (twelve) hours.   valACYclovir 500 MG tablet Commonly known as: VALTREX Take 1 tablet (500 mg total) by mouth 2 (two) times daily.         Discharge home in stable condition Infant Feeding: Bottle and Breast Infant Disposition:home with mother Discharge instruction: per After Visit Summary and Postpartum booklet. Activity: Advance as tolerated. Pelvic rest for 6 weeks.  Diet: routine diet Future Appointments: Future Appointments  Date Time Provider Department Center  01/08/2022 11:10 AM Donette Larry, CNM [facility]  01/15/2022 10:10 AM Donette Larry, CNM [facility]  01/21/2022  2:50 PM Milas Hock, MD [facility]  01/28/2022  2:30 PM Milas Hock, MD [facility]   Follow up Visit:  Follow-up Information     [facility] Follow up.   Specialty: Obstetrics and Gynecology Why: An appointment will be scheduled for you to check on your blood pressure next week Contact information: [address] [place] [address] [place] [address] [phone]               Message sent to [facility] by Dr. Ephriam Jenkins on 7/12  Please schedule this patient for a In person postpartum visit in 4 weeks with the following provider: MD. Additional Postpartum F/U:Postpartum Depression checkup and BP check 1 week  High risk pregnancy complicated by: HTN Delivery mode:  Vaginal, Spontaneous  Anticipated Birth Control:   planning partner vasectomy   01/08/2022 Catalina Antigua, MD

## 2022-01-06 NOTE — Lactation Note (Addendum)
This note was copied from a baby's chart. Lactation Consultation Note  Patient Name: Amber Dennis Today's Date: 01/06/2022 Reason for consult: Initial assessment;Mother's request;Early term 37-38.6wks;Breastfeeding assistance Age:25 hours  P2, Early Term, Female Infant  Infant at 49 hours old. LC entered the room and baby was asleep in the bassinet. Per mom, she has not breast fed baby because she was told to wait for the [facility] to see her. [facility] taught mom how to hand express and we attempted to get baby to latch on the left breast in the cross cradle position. Baby was sleepy and uninterested. Mom was able to hand express 72mL and we fed it to baby via a spoon.   LC informed mom that because her baby is an early term baby, she may be sleepy. LC encouraged mom to hand express and spoon feed baby and wake baby to feed if she is sleepy if it has been more than 3 hours.   Mom is currently on Mag and has her own Medela pump. LC assisted mom in checking her flange size. Mom says that the size #24 flange fits well.   LC informed mom that we typically watch babies that are early term to see what they can do at the breast.  Mom is aware that feedings should last 15 min or more once baby has turned 24 hours old. If baby is falling asleep at the breast, having trouble latching, or is not feeding well, then we will begin pumping.   Mom will call LC when baby is awake and showing feeding cues to assess the latch.   If baby has not awakened within 3 hours, mom is to hand express and feed expressed milk to baby via a spoon.   Mom is aware that the current feeding plan may change.   Current Feeding Plan:  Breastfeed 8+ times in 24 hours according to feeding cues.  Hand express and spoon feed baby expressed milk via a spoon.  Call RN/LC for assistance with breastfeeding.   Maternal Data Has patient been taught Hand Expression?: Yes Does the patient have breastfeeding experience prior to this delivery?:  Yes How long did the patient breastfeed?: Mom breastfed her oldest for 6 months and supplemented with formula  Interventions Interventions: Breast feeding basics reviewed;Assisted with latch;Breast massage;Hand express;Education;LC Services brochure  Discharge Pump: DEBP;Personal (Medela)  Consult Status Consult Status: Follow-up Date: 01/07/22 Follow-up type: In-patient    Delene Loll 01/06/2022, 12:58 PM

## 2022-01-06 NOTE — Progress Notes (Signed)
Labor Progress Note Amber Dennis is a 26 y.o. [phone] at [redacted]w[redacted]d presented for IOL for cHTN with severe preeclampsia   Called to room as patient SROMed and having decels  S:  Patient reports feeling a lot of pressure.   O:  BP 109/74   Pulse 85   Temp 98 F (36.7 C) (Oral)   Resp 19   Ht 5\' 6"  (1.676 m)   Wt 107.4 kg   LMP 04/22/2021   SpO2 100%   BMI 38.20 kg/m  EFM: 130/moderate variability/+accels/no decels  CVE: Dilation: 6.5 Effacement (%): 90 Cervical Position: Posterior Station: -1 Presentation: Vertex Exam by:: Dr. [email or web address]   A&P: 26 y.o. 22 at [redacted]w[redacted]d presented for IOL for cHTN with severe preeclampsia  #Labor: cervix 6-7cm but stretches to 8 cm. Head better applied and confirmed SROM. IUPC placed during current check. Immediately after SROM had decel but improved with position changes and confirmed no cord prolapse on check. Pitocin turned off. Will expectantly manage for now after AROM. Can do amnioinfusion if having variables.  Reassess contraction pattern and fetal tracing in 30 min to an hour. If spaced out or not adequate MVUs can restart pitocin as long as reassuring fetal status #Pain: epidural in place #FWB: Cat II but improved to cat I #GBS positive   [redacted]w[redacted]d, MD, MPH OB Fellow, Faculty Practice

## 2022-01-06 NOTE — Lactation Note (Signed)
This note was copied from a baby's chart. Lactation Consultation Note  Patient Name: Amber Dennis Today's Date: 01/06/2022   Age:25 hours  LC contacted RN regarding L&D visit.  Mom was sleeping.  RN will  vocera LC when mom desires visit.    Maternal Data    Feeding    LATCH Score                    Lactation Tools Discussed/Used    Interventions    Discharge    Consult Status      Maryruth Hancock [facility] 01/06/2022, 7:57 AM

## 2022-01-06 NOTE — Progress Notes (Signed)
Delayed documentation of events that occurred between 0030 - 0430  Tracing improved with no more decels. Cat I with moderate variability and accels  Restarted pitocin 1x1  Patient called out that in 10/10 back/hip pain on left side. Cervix unchanged at 6 cm and now swollen. Has been pressing epidural button without relief.  - tried benadryl (for swollen cervix) and flexeril (for back pain)  Reassessed an hour later and patient requested a cesarean delivery due to her pain. We discussed that at this time there is no medical indication for cesarean delivery and our recommendation is to work on helping with the pain. We call the anesthesiologist Dr. Donavan Foil for an assessment of patient and a potential redose.  While awaiting for Dr. Donavan Foil patient repositioned and it was discovered that her epidural catheter was no longer connected to the tip. Dr. Donavan Foil in room and reconnected tubing and redosed her epidural.  Soon after patient much more comfortable and able to fall asleep.  Warner Mccreedy, MD, MPH OB Fellow, Faculty Practice
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
## 2022-01-07 LAB — CBC
HCT: 27.9 % — ABNORMAL LOW (ref 36.0–46.0)
Hemoglobin: 8.6 g/dL — ABNORMAL LOW (ref 12.0–15.0)
MCH: 25 pg — ABNORMAL LOW (ref 26.0–34.0)
MCHC: 30.8 g/dL (ref 30.0–36.0)
MCV: 81.1 fL (ref 80.0–100.0)
Platelets: 193 10*3/uL (ref 150–400)
RBC: 3.44 MIL/uL — ABNORMAL LOW (ref 3.87–5.11)
RDW: 15.1 % (ref 11.5–15.5)
WBC: 17.5 10*3/uL — ABNORMAL HIGH (ref 4.0–10.5)
nRBC: 0 % (ref 0.0–0.2)

## 2022-01-07 MED ORDER — NIFEDIPINE ER OSMOTIC RELEASE 60 MG PO TB24
60.0000 mg | ORAL_TABLET | Freq: Every day | ORAL | Status: DC
  Administered 2022-01-07: 60 mg via ORAL
  Filled 2022-01-07: qty 1

## 2022-01-07 MED ORDER — FERROUS GLUCONATE 324 (38 FE) MG PO TABS
324.0000 mg | ORAL_TABLET | ORAL | Status: DC
  Administered 2022-01-07: 324 mg via ORAL
  Filled 2022-01-07: qty 1

## 2022-01-07 MED ORDER — NIFEDIPINE ER OSMOTIC RELEASE 60 MG PO TB24
60.0000 mg | ORAL_TABLET | Freq: Two times a day (BID) | ORAL | Status: DC
  Administered 2022-01-07 – 2022-01-08 (×2): 60 mg via ORAL
  Filled 2022-01-07 (×2): qty 1

## 2022-01-07 NOTE — Progress Notes (Signed)
POSTPARTUM PROGRESS NOTE  PPD #1  Subjective:  Amber Dennis is a 26 y.o. [id number] s/p NSVD at [redacted]w[redacted]d. Today she notes no acute complaints. She denies any problems with ambulating, voiding or po intake. Denies nausea or vomiting. She has not yet passing flatus, no BM.  Pain is well controlled.  Notes some swelling in her lower extremities.  Lochia appropriate Denies fever/chills/chest pain/SOB.  no HA, no blurry vision, noRUQ pain  Objective: Blood pressure (!) 141/80, pulse 95, temperature 97.9 F (36.6 C), temperature source Oral, resp. rate 17, height 5\' 6"  (1.676 m), weight 107.4 kg, last menstrual period 04/22/2021, SpO2 100 %, unknown if currently breastfeeding.  Physical Exam:  General: alert, cooperative and no distress Chest: no respiratory distress Heart: regular rate and rhythm Abdomen: soft, nontender, +BS Uterine Fundus: firm, appropriately tender Incision: n/a DVT Evaluation: no calf tenderness bilaterally Extremities: 2+ edema Skin: warm, dry  Results for orders placed or performed during the hospital encounter of 01/04/22 (from the past 24 hour(s))  CBC     Status: Abnormal   Collection Time: 01/07/22  5:12 AM  Result Value Ref Range   WBC 17.5 (H) 4.0 - 10.5 K/uL   RBC 3.44 (L) 3.87 - 5.11 MIL/uL   Hemoglobin 8.6 (L) 12.0 - 15.0 g/dL   HCT 01/09/22 (L) 24.4 - 01.0 %   MCV 81.1 80.0 - 100.0 fL   MCH 25.0 (L) 26.0 - 34.0 pg   MCHC 30.8 30.0 - 36.0 g/dL   RDW 27.2 53.6 - 64.4 %   Platelets 193 150 - 400 K/uL   nRBC 0.0 0.0 - 0.2 %    Assessment/Plan: Amber Dennis is a 26 y.o. 22 s/p NSVD at [redacted]w[redacted]d PPD#1 complicated by: 1) cHTN with superimposed preeclampsia with severe features -currently on Mag to be discontinued this am @ 715 -continue Lasix x 5 days -currently on Procardia XL30mg  daily, will continue to closely monitor BP -currently aysmptomatic  -meeting postpartum milestones appropriately Contraception: plan for vasectomy Feeding: both  Dispo: Plan to  monitor BPs today, likely discharge tomorrow   LOS: 3 days   [redacted]w[redacted]d, DO Faculty Attending, [facility] 01/07/2022, 6:42 AM

## 2022-01-08 ENCOUNTER — Other Ambulatory Visit (HOSPITAL_COMMUNITY): Payer: Self-pay

## 2022-01-08 ENCOUNTER — Encounter: Payer: Medicaid Other | Admitting: Certified Nurse Midwife

## 2022-01-08 MED ORDER — FERROUS GLUCONATE 324 (38 FE) MG PO TABS
324.0000 mg | ORAL_TABLET | ORAL | 3 refills | Status: AC
  Filled 2022-01-08: qty 30, 60d supply, fill #0

## 2022-01-08 MED ORDER — FUROSEMIDE 20 MG PO TABS
20.0000 mg | ORAL_TABLET | Freq: Every day | ORAL | 0 refills | Status: AC
  Filled 2022-01-08: qty 4, 4d supply, fill #0

## 2022-01-08 MED ORDER — ACETAMINOPHEN 325 MG PO TABS
650.0000 mg | ORAL_TABLET | ORAL | 4 refills | Status: AC | PRN
  Filled 2022-01-08: qty 30, 3d supply, fill #0

## 2022-01-08 MED ORDER — NIFEDIPINE ER 60 MG PO TB24
60.0000 mg | ORAL_TABLET | Freq: Two times a day (BID) | ORAL | 0 refills | Status: AC
  Filled 2022-01-08: qty 60, 30d supply, fill #0

## 2022-01-08 NOTE — Lactation Note (Signed)
This note was copied from a baby's chart. Lactation Consultation Note  Patient Name: Amber Dennis Today's Date: 01/08/2022 Reason for consult: Follow-up assessment;Mother's request;Early term 37-38.6wks;Hyperbilirubinemia;Breastfeeding assistance Age:25 hours Infant adequate urine and stool output, with transitioning of stool color.   LC assisted to get more depth on the breast noting increase in swallows with breast compression.   Mom latching at the breast and following up with giving her EBM first followed by formula with pace bottle feeding and slow flow nipple. BF supplementation guide provided and reviewed.  Mom using personal pump after each feeding for 15 mins.  All questions answered at the end of the visit.   Maternal Data Has patient been taught Hand Expression?: Yes  Feeding Mother's Current Feeding Choice: Breast Milk and Formula Nipple Type: Extra Slow Flow  LATCH Score Latch: Repeated attempts needed to sustain latch, nipple held in mouth throughout feeding, stimulation needed to elicit sucking reflex.  Audible Swallowing: Spontaneous and intermittent  Type of Nipple: Everted at rest and after stimulation  Comfort (Breast/Nipple): Soft / non-tender  Hold (Positioning): Assistance needed to correctly position infant at breast and maintain latch.  LATCH Score: 8   Lactation Tools Discussed/Used Tools: Pump;Flanges Flange Size: 24 Breast pump type: Other (comment) (personal) Pump Education: Setup, frequency, and cleaning;Milk Storage Reason for Pumping: increase stimulation Pumping frequency: post pump after each feeding for 15 mins  Interventions Interventions: Breast feeding basics reviewed;Assisted with latch;Skin to skin;Breast massage;Hand express;Breast compression;Adjust position;Support pillows;Position options;Expressed milk;DEBP;Education;Pace feeding;Scientist, research (physical sciences)  Discharge Pump:  Personal  Consult Status Consult Status: Follow-up Date: 01/09/22 Follow-up type: In-patient    Amber Dennis  [name] 01/08/2022, 3:57 PM

## 2022-01-08 NOTE — Progress Notes (Signed)
Discharge instructions and paperwork reviewed with patient. Patient verbalizes understanding. Patient meds to bed provided by pharmacy. Patient alert and oriented x4. Vitals WNL.

## 2022-01-09 ENCOUNTER — Ambulatory Visit: Payer: Self-pay

## 2022-01-09 NOTE — Lactation Note (Signed)
This note was copied from a baby's chart. Lactation Consultation Note  Patient Name: Amber Dennis Today's Date: 01/09/2022 Reason for consult: Follow-up assessment;Early term 88-38.6wks Age:25 hours  Visited with mom of 46 hours old ETI female, she's a P2 and experienced breastfeeding. Parents are taking baby home today, bilirubin shows improvement as well as weight loss. Reviewed discharge education, feeding cues, cluster feeding, size of baby's stomach, milk synthesis rate, supply/demand and supplementation guidelines. Mom's plan is to pump and provide her EBM/formula until breastfeeding is fully established, that's her preference prior taking baby to breast. Encouraged consistent pumping for the full onset of lactogenesis II, to protect her supply and for the prevention of engorgement. Parents have F/U with pediatrician in two days and said they'll wait and see prior contacting LC OP. All questions and concerns answered, parents aware of LC services and will call PRN.  Feeding Mother's Current Feeding Choice: Breast Milk and Formula  Lactation Tools Discussed/Used Tools: Pump;Flanges Flange Size: 24 Breast pump type: Other (comment) (Personal Medela) Pump Education: Setup, frequency, and cleaning;Milk Storage Reason for Pumping: ETI, supplementation Pumping frequency: 5 times/24 hours Pumped volume: 20 mL  Interventions Interventions: Breast feeding basics reviewed;DEBP;Education  Discharge Discharge Education: Outpatient recommendation;Warning signs for feeding baby Pump: Personal (Medela Pump & Style)  Consult Status Consult Status: Complete Date: 01/09/22 Follow-up type: Call as needed   Amber Dennis Venetia Constable 01/09/2022, 12:00 PM

## 2022-01-09 NOTE — Clinical Social Work Maternal (Addendum)
CLINICAL SOCIAL WORK MATERNAL/CHILD NOTE  Patient Details  Name: Amber Dennis MRN: [id number] Date of Birth: 01/29/1996  Date:  01/09/2022  Clinical Social Worker Initiating Note:  Takeia Ciaravino, LCSWA Date/Time: Initiated:  01/09/22/1438     Child's Name:  Amber Dennis   Biological Parents:  Mother, Father   Need for Interpreter:  None   Reason for Referral:  Behavioral Health Concerns   Address:  [address] [place] [address]    Phone number:  [phone] (home)     Additional phone number:   Household Members/Support Persons (HM/SP):   Household Member/Support Person 1, Household Member/Support Person 2   HM/SP Name Relationship DOB or Age  HM/SP -1 Zachary Dennis FOB 07/02/1994  HM/SP -2 Roman Dennis Son 25 years  HM/SP -3        HM/SP -4        HM/SP -5        HM/SP -6        HM/SP -7        HM/SP -8          Natural Supports (not living in the home):  Immediate Family   Professional Supports: Organized support group (Comment)   Employment: Unemployed   Type of Work:     Education:  High school graduate   Homebound arranged:    Financial Resources:  Medicaid   Other Resources:  WIC, Food Stamps   (Placed WIC referral)   Cultural/Religious Considerations Which May Impact Care:  None identified  Strengths:  Ability to meet basic needs  , Pediatrician chosen, Home prepared for child     Psychotropic Medications:         Pediatrician:    [place] (including [place])  Pediatrician List:   [place]    [place]    [place]    [place]    [place]    [place] [facility]    Pediatrician Fax Number:    Risk Factors/Current Problems:  Mental Health Concerns     Cognitive State:  Able to Concentrate  , Alert  , Goal Oriented  , Linear Thinking  , Insightful     Mood/Affect:  Bright  , Interested  , Comfortable  , Happy     CSW Assessment: CSW met with MOB at bedside to address  consult for history of anxiety, depression, and bipolar disorder and complete psychosocial assessment. When CSW entered room, FOB was present. MOB provided verbal consent to speak in front of FOB about anything. CSW introduced self and explained reason for consult. MOB presented as warm, forthcoming, and easy to engage.   CSW inquired about MOB's mental health history. MOB reports she was diagnosed with anxiety and depression in 2012 and diagnosed with bipolar I disorder in 2014, marked by experiencing a hypomanic episode. MOB denies a history of auditory/visual hallucinations. MOB reports she experienced postpartum depression with her son who is 4 years old which resolved 1 year postpartum. MOB explained her postpartum depression symptoms as follows: she felt anger towards FOB, would pick fights with FOB, felt sluggish and depressed, her sleep was off schedule, did not make healthy lifestyle choices at the time, and recalls that she did not let others help her care for baby and would check to make sure he was breathing "religiously." MOB reports she received medication management for postpartum depression symptoms, stating she was prescribed an antidepressant and an anxiety medication through her OBGYN. MOB reports she discontinued   psychotropic medication 1 year postpartum and reports she felt "back to normal." CSW inquired about anxiety/depression/hypomanic symptoms during MOB's current pregnancy/delivery. MOB reports she took buspirone PRN for anxiety symptoms during her 2nd trimester which she reports was helpful. Per chart review, MOB experienced panic attacks 4/23-5/23 but did not bring this up during consult. MOB reports she has not taken buspirone since 10/2021 and reports she does not feel like she has experienced depression or anxiety since 10/2021. MOB reports she has not attended therapy in the past but reports she attended support groups in the past for PPD. CSW inquired about MOB's support and coping  skills. MOB reports she receives support from FOB's entire family and her dad. MOB identified going outside, gardening, going to the park, taking showers, and reflecting on how her life has changed for the better as coping skills. MOB declined a list of mental health resources, stating her caseworker through [employer] has provided her with a mental health resource list for [place] where she resides. MOB denies current SI/HI. DV was not assessed due to FOB being present.   CSW inquired how MOB has felt emotionally since giving birth. MOB reports she feels she is on "cloud 9." No acute mental health symptoms were identified during consult.   MOB reports she does not receive WIC. MOB provided verbal consent for CSW to place a WIC referral. MOB receives food stamps. MOB reports she has all needed items for infant, including a car seat and crib. MOB declined additional resource needs.  CSW provided education regarding the baby blues period vs. perinatal mood disorders, discussed treatment and encouraged MOB to utilize [place] resources for mental health follow up if concerns arise. CSW provided psycho-education about wellness practices during the postpartum period. CSW recommends self-evaluation during the postpartum time period using the New Mom Checklist from Postpartum Progress and encouraged MOB to contact a medical professional if symptoms are noted at any time.    CSW provided review of Sudden Infant Death Syndrome (SIDS) precautions.    CSW identifies no further need for intervention and no barriers to discharge at this time.   CSW Plan/Description:  No Further Intervention Required/No Barriers to Discharge, Sudden Infant Death Syndrome (SIDS) Education, Perinatal Mood and Anxiety Disorder (PMADs) Education, Other Information/Referral to Community Resources    Kaitlyne Friedhoff K Lionardo Haze, LCSWA 01/09/2022, 2:43 PM 

## 2022-01-14 ENCOUNTER — Telehealth (HOSPITAL_COMMUNITY): Payer: Self-pay | Admitting: *Deleted

## 2022-01-14 NOTE — Telephone Encounter (Signed)
Left phone voicemail message.  Duffy Rhody, RN 01-14-2022 at 1:10pm

## 2022-01-15 ENCOUNTER — Encounter: Payer: Medicaid Other | Admitting: Certified Nurse Midwife

## 2022-01-21 ENCOUNTER — Encounter: Payer: Medicaid Other | Admitting: Obstetrics and Gynecology

## 2022-01-22 ENCOUNTER — Telehealth: Payer: Self-pay | Admitting: *Deleted

## 2022-01-22 NOTE — Telephone Encounter (Signed)
Left patient an urgent message to call and schedule BP and 4 week Postpartum appointment.

## 2022-01-27 ENCOUNTER — Inpatient Hospital Stay (HOSPITAL_COMMUNITY): Admit: 2022-01-27 | Payer: Self-pay

## 2022-01-28 ENCOUNTER — Encounter: Payer: Medicaid Other | Admitting: Obstetrics and Gynecology

## 2022-02-15 ENCOUNTER — Ambulatory Visit: Payer: Medicaid Other | Admitting: Obstetrics and Gynecology

## 2022-02-23 ENCOUNTER — Ambulatory Visit: Payer: Medicaid Other | Admitting: Obstetrics and Gynecology

## 2022-02-23 NOTE — Progress Notes (Deleted)
    Post Partum Visit Note  Amber Dennis is a 26 y.o. [phone] female who presents for a postpartum visit. She is 7 weeks postpartum following a normal spontaneous vaginal delivery.  I have fully reviewed the prenatal and intrapartum course. The delivery was at 37.0 gestational weeks.  Anesthesia: epidural. Postpartum course has been unremarkable. Baby is doing well. Baby is feeding by {breastmilk/bottle:69}. Bleeding {vag bleed:12292}. Bowel function is {normal:32111}. Bladder function is {normal:32111}. Patient {is/is not:9024} sexually active. Contraception method is {contraceptive method:5051}. Postpartum depression screening: {gen negative/positive:315881}.   The pregnancy intention screening data noted above was reviewed. Potential methods of contraception were discussed. The patient elected to proceed with No data recorded.    Health Maintenance Due  Topic Date Due   HPV VACCINES (1 - Risk 3-dose series) Never done   PAP-Cervical Cytology Screening  Never done   PAP SMEAR-Modifier  Never done   COVID-19 Vaccine (3 - Moderna risk series) 08/09/2020   INFLUENZA VACCINE  01/26/2022    {Common ambulatory SmartLinks:19316}  Review of Systems {ros; complete:30496}  Objective:  LMP 04/22/2021    General:  {gen appearance:16600}   Breasts:  {desc; normal/abnormal/not indicated:14647}  Lungs: {lung exam:16931}  Heart:  {heart exam:5510}  Abdomen: {abdomen exam:16834}   Wound {Wound assessment:11097}  GU exam:  {desc; normal/abnormal/not indicated:14647}       Assessment:    There are no diagnoses linked to this encounter.  *** postpartum exam.   Plan:   Essential components of care per ACOG recommendations:  1.  Mood and well being: Patient with {gen negative/positive:315881} depression screening today. Reviewed local resources for support.  - Patient tobacco use? {tobacco use:25506}  - hx of drug use? {yes/no:25505}    2. Infant care and feeding:  -Patient currently  breastmilk feeding? {yes/no:25502}  -Social determinants of health (SDOH) reviewed in EPIC. No concerns***The following needs were identified***  3. Sexuality, contraception and birth spacing - Patient {DOES_DOES YTK:16010} want a pregnancy in the next year.  Desired family size is {NUMBER 1-10:22536} children.  - Reviewed reproductive life planning. Reviewed contraceptive methods based on pt preferences and effectiveness.  Patient desired {Upstream End Methods:24109} today.   - Discussed birth spacing of 18 months  4. Sleep and fatigue -Encouraged family/partner/community support of 4 hrs of uninterrupted sleep to help with mood and fatigue  5. Physical Recovery  - Discussed patients delivery and complications. She describes her labor as {description:25511} - Patient had a {CHL AMB DELIVERY:[phone]}. Patient had a {laceration:25518} laceration. Perineal healing reviewed. Patient expressed understanding - Patient has urinary incontinence? {yes/no:25515} - Patient {ACTION; IS/IS XNA:35573220} safe to resume physical and sexual activity  6.  Health Maintenance - HM due items addressed {Yes or If no, why not?:20788} - Last pap smear No results found for: "DIAGPAP" Pap smear {done:10129} at today's visit.  -Breast Cancer screening indicated? {indicated:25516}  7. Chronic Disease/Pregnancy Condition follow up: {Follow up:25499}  - PCP follow up  Kathie Dike, CMA [facility] [employer], [facility]

## 2022-06-30 ENCOUNTER — Encounter: Payer: Self-pay | Admitting: Advanced Practice Midwife

## 2022-07-01 ENCOUNTER — Other Ambulatory Visit: Payer: Self-pay | Admitting: Emergency Medicine

## 2022-07-01 DIAGNOSIS — A6000 Herpesviral infection of urogenital system, unspecified: Secondary | ICD-10-CM

## 2022-07-01 MED ORDER — VALACYCLOVIR HCL 500 MG PO TABS: 500.0000 mg | ORAL_TABLET | Freq: Two times a day (BID) | ORAL | 6 refills | Status: AC

## 2022-07-01 NOTE — Progress Notes (Signed)
Rx for Valtrex refilled.

## 2023-05-13 IMAGING — US US MFM OB FOLLOW-UP
1 series · 14 of 28 positions shown · non-contrast
Comparison: none

[Series 1: us mfm ob follow-up · 56 acquisitions, 14 frames shown]
[im 3/56]
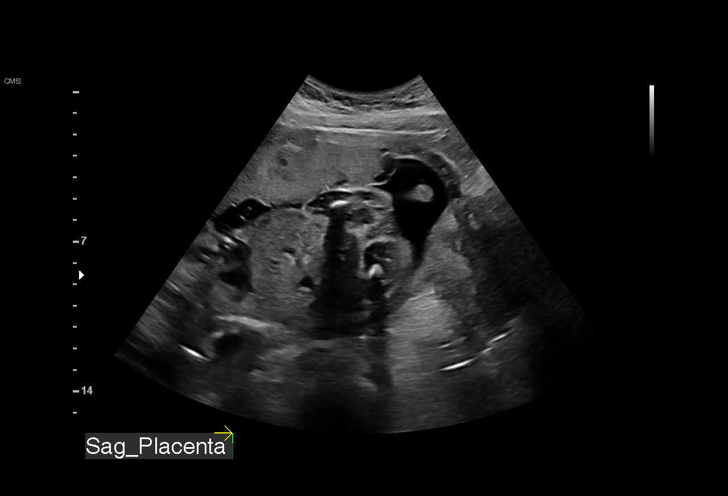
[im 7/56]
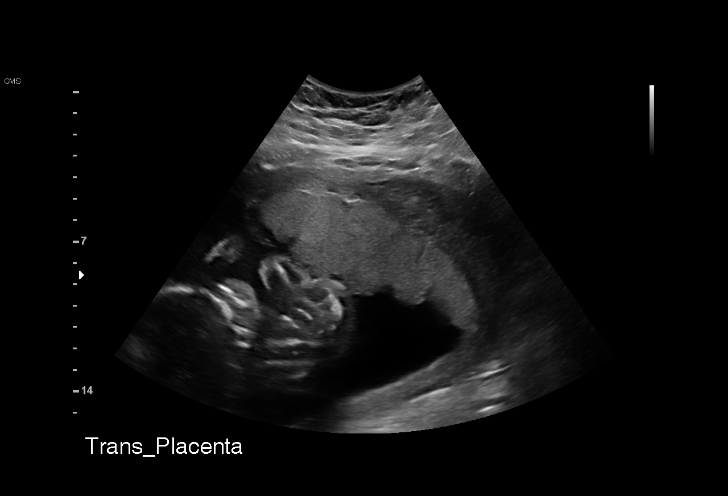
[im 11/56]
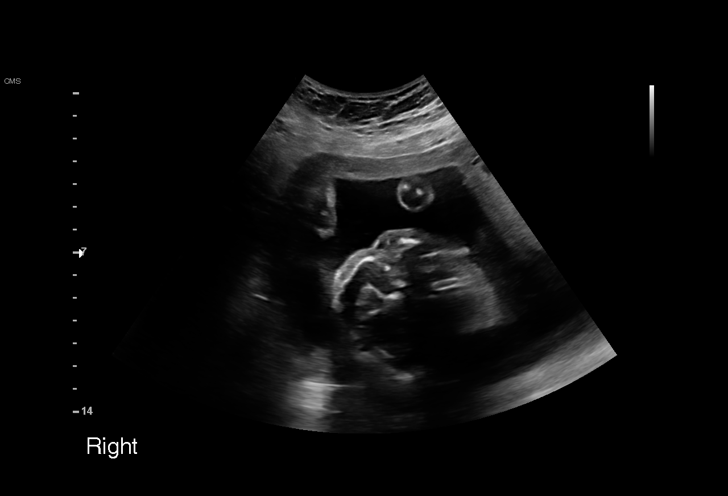
[im 15/56]
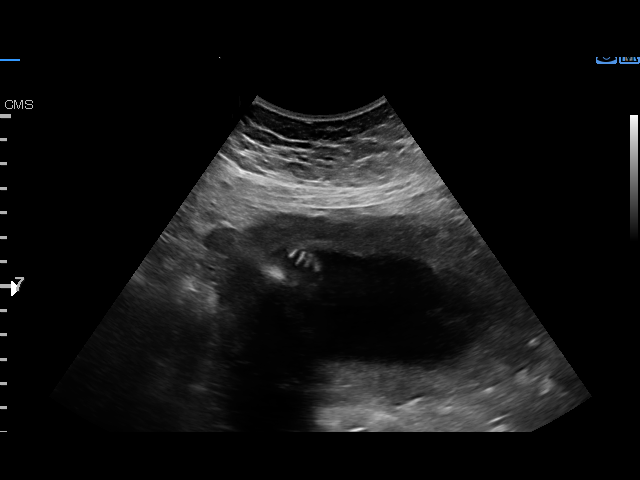
[im 19/56]
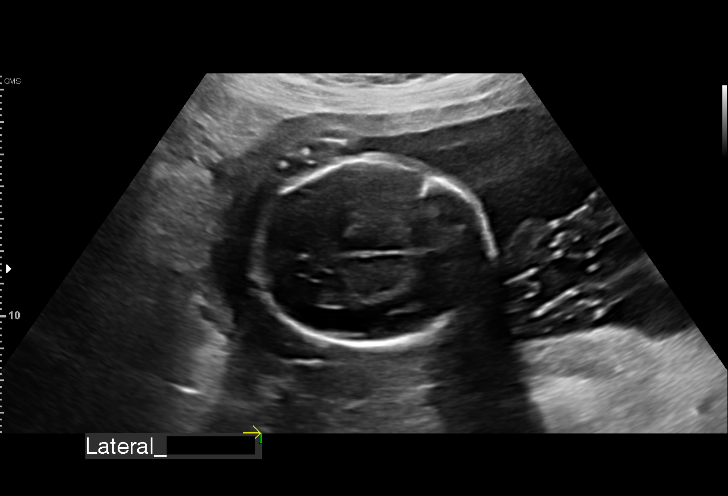
[im 23/56]
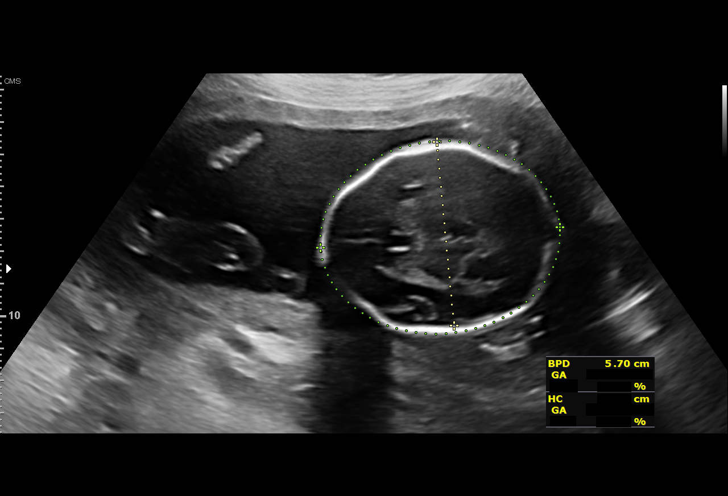
[im 27/56]
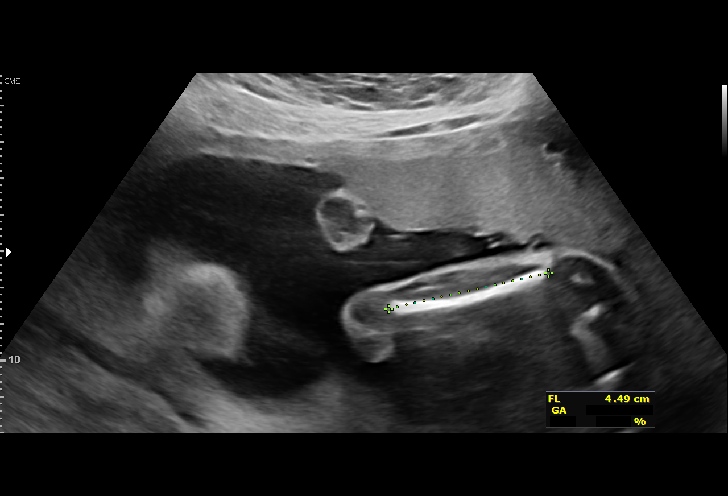
[im 31/56]
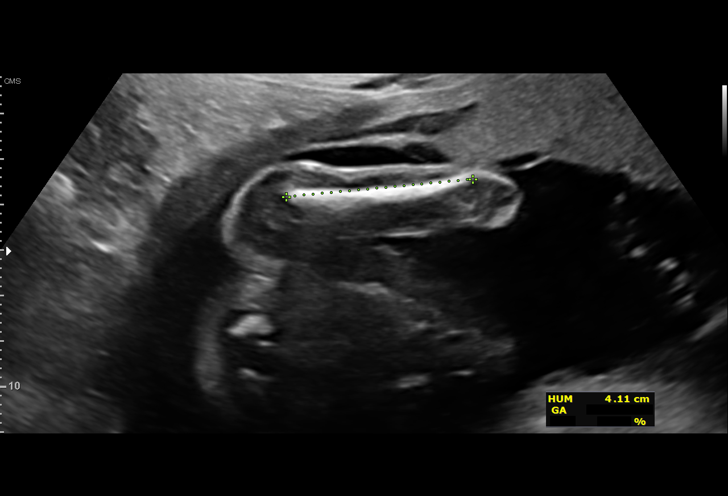
[im 35/56]
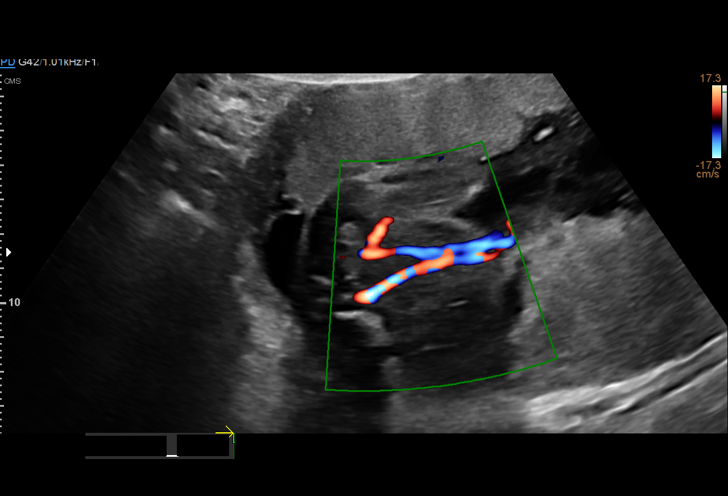
[im 39/56]
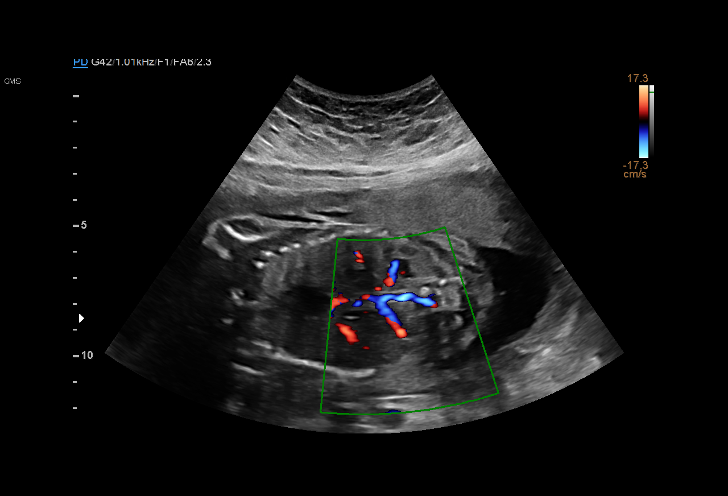
[im 43/56]
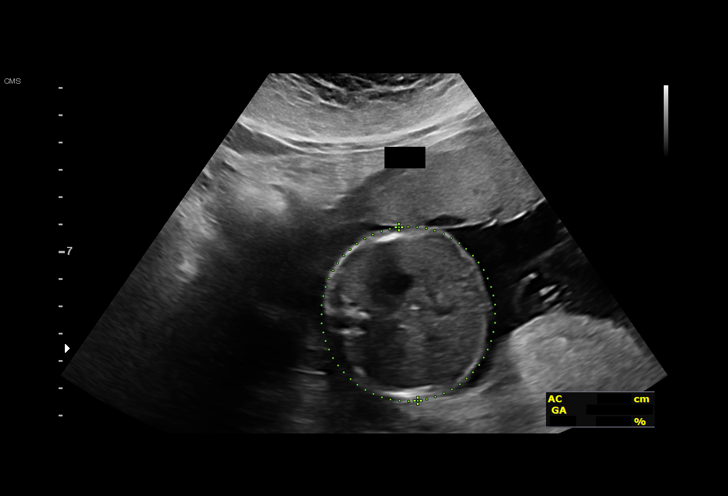
[im 47/56]
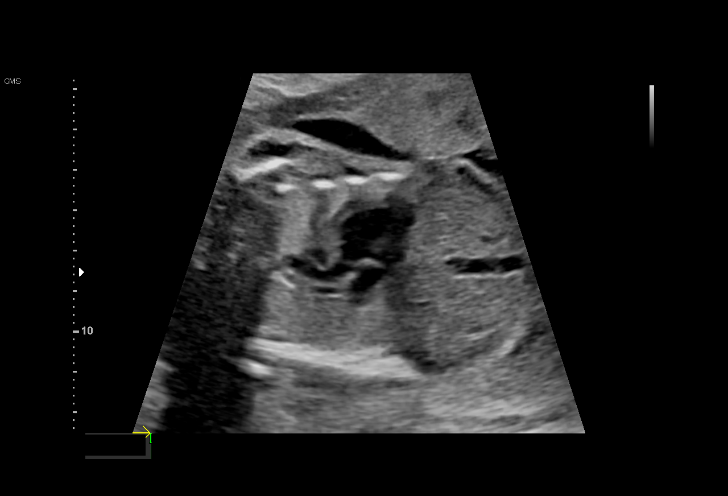
[im 51/56]
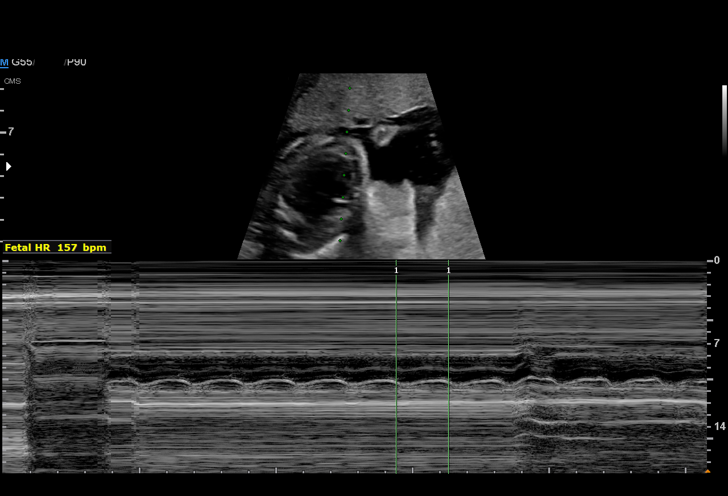
[im 56/56]
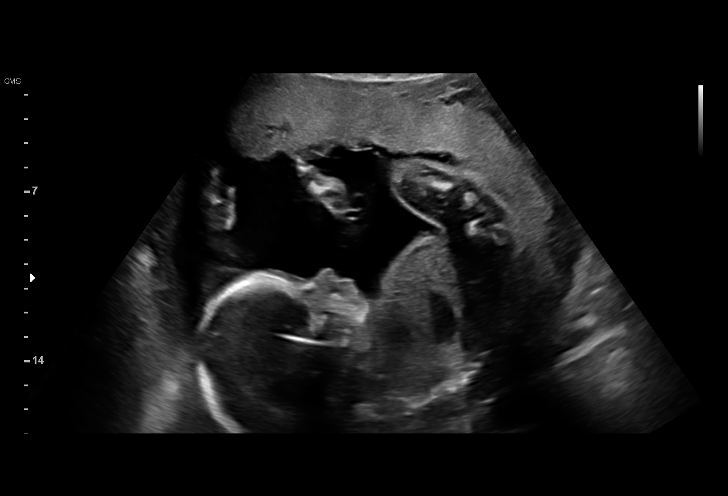

[14 of 28 positions shown; findings below may reference images not displayed]

Indications

 23 weeks gestation of pregnancy
 Antenatal follow-up for nonvisualized fetal
 anatomy
 Hypertension - Chronic/Pre-existing (no
 meds)
 LR NIPS/ Neg AFp
Fetal Evaluation

 Num Of Fetuses:         1
 Fetal Heart Rate(bpm):  157
 Cardiac Activity:       Observed
 Presentation:           Breech
 Placenta:               Anterior
 P. Cord Insertion:      Visualized

 Amniotic Fluid
 AFI FV:      Within normal limits

                             Largest Pocket(cm)

Biometry

 BPD:      56.7  mm     G. Age:  23w 2d         58  %    CI:        71.92   %    70 - 86
                                                         FL/HC:      20.4   %    19.2 -
 HC:      212.8  mm     G. Age:  23w 2d         49  %    HC/AC:      1.06        1.05 -
 AC:      201.3  mm     G. Age:  24w 5d         89  %    FL/BPD:     76.5   %    71 - 87
 FL:       43.4  mm     G. Age:  24w 2d         78  %    FL/AC:      21.6   %    20 - 24
 HUM:      40.8  mm     G. Age:  24w 5d         82  %
 CER:      25.1  mm     G. Age:  22w 6d         75  %
 LV:        6.9  mm
 CM:          5  mm

 Est. FW:     687  gm      1 lb 8 oz     95  %
OB History

 Gravidity:    6         Term:   1         SAB:   2
 TOP:          2        Living:  1
Gestational Age

 LMP:           23w 0d        Date:  04/22/21                 EDD:   01/27/22
 U/S Today:     23w 6d                                        EDD:   01/21/22
 Best:          23w 0d     Det. By:  LMP  (04/22/21)          EDD:   01/27/22
Anatomy

 Cranium:               Appears normal         Aortic Arch:            Previously seen
 Cavum:                 Appears normal         Ductal Arch:            Previously seen
 Ventricles:            Appears normal         Diaphragm:              Appears normal
 Choroid Plexus:        Previously seen        Stomach:                Appears normal, left
                                                                       sided
 Cerebellum:            Appears normal         Abdomen:                Appears normal
 Posterior Fossa:       Appears normal         Abdominal Wall:         Appears nml (cord
                                                                       insert, abd wall)
 Nuchal Fold:           Previously seen        Cord Vessels:           Appears normal (3
                                                                       vessel cord)
 Face:                  Appears normal         Kidneys:                Appear normal
                        (orbits and profile)
 Lips:                  Appears normal         Bladder:                Appears normal
 Thoracic:              Appears normal         Spine:                  Previously seen
 Heart:                 Appears normal         Upper Extremities:      Previously seen
                        (4CH, axis, and
                        situs)
 RVOT:                  Appears normal         Lower Extremities:      Previously seen
 LVOT:                  Appears normal

 Other:  Female gender previously seen.Nasal bone, lenses, 3VV, VC,
         heels/feet, RIGHT open hand/5th digit visualized. Technically difficult
         due to fetal position.
Cervix Uterus Adnexa

 Cervix
 Length:           4.38  cm.
 Normal appearance by transabdominal scan.

 Uterus
 No abnormality visualized.

 Right Ovary
 Not visualized.

 Left Ovary
 Not visualized.

 Cul De Sac
 No free fluid seen.
 Adnexa
 No abnormality visualized.
Impression

 Follow up growth due to chronic hypertension and to
 complete the fetal anatomy.
 Normal interval growth with measurements consistent with
 dates
 Good fetal movement and amniotic fluid volume
Recommendations

 Follow up growth in 4 weeks.

## 2023-06-10 IMAGING — US US MFM OB FOLLOW-UP
1 series · 14 of 28 positions shown · non-contrast
Comparison: none

[Series 1: us mfm ob follow-up · 39 acquisitions, 14 frames shown]
[im 2/39]
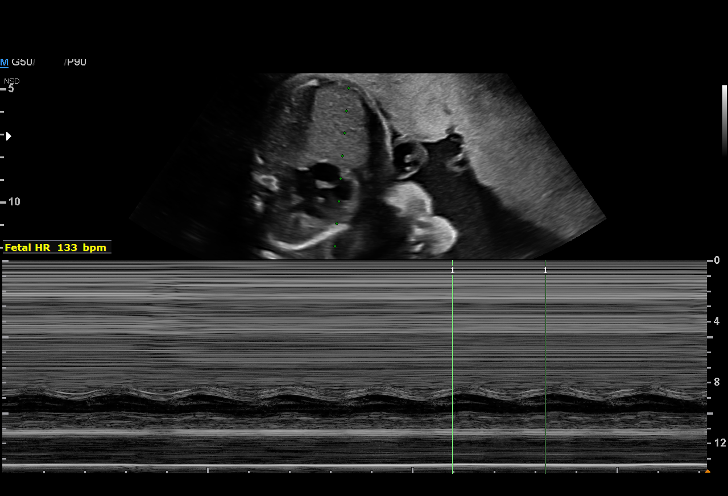
[im 5/39]
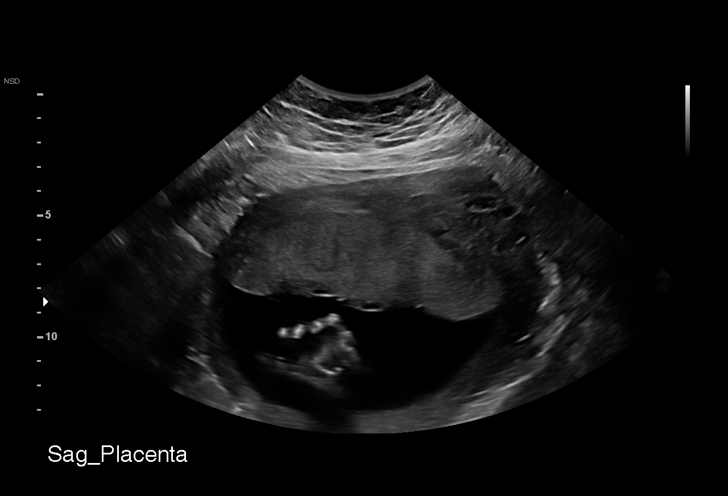
[im 8/39]
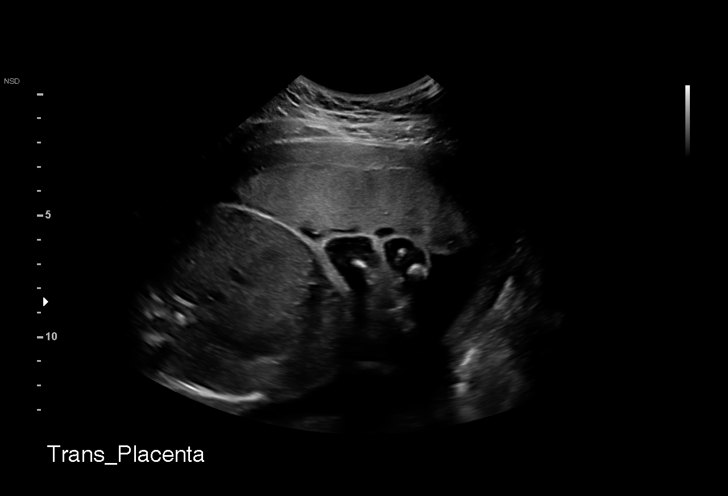
[im 10/39]
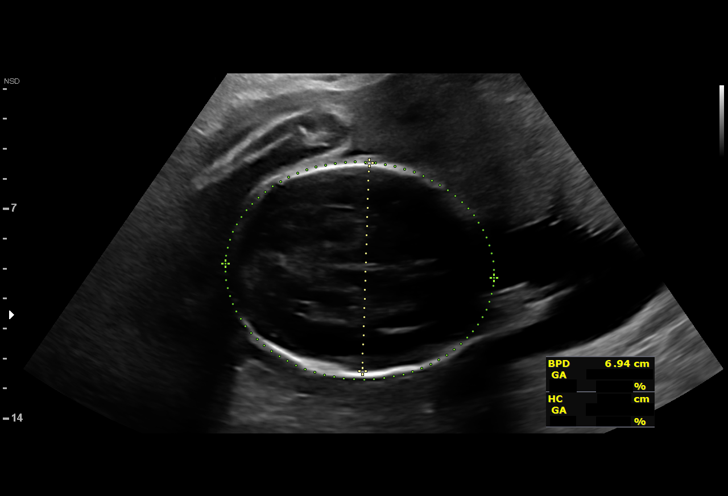
[im 13/39]
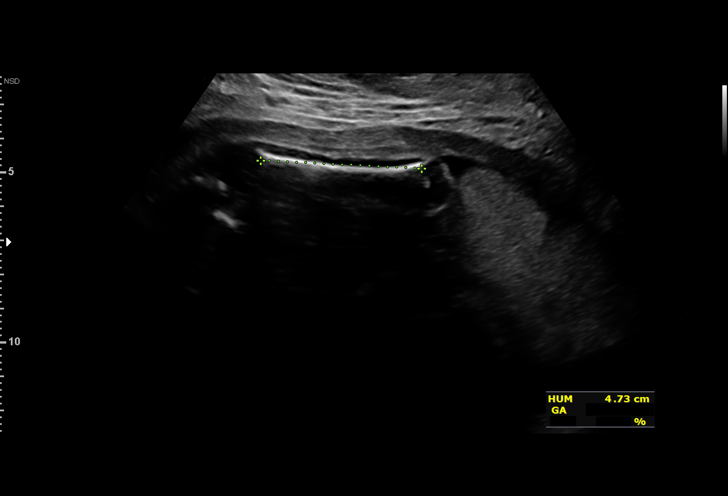
[im 16/39]
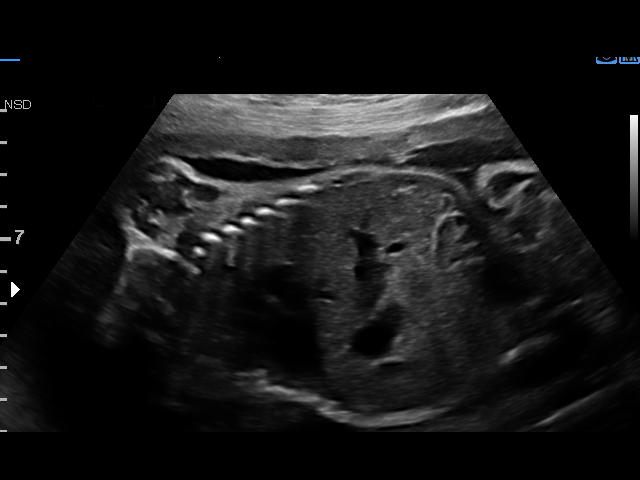
[im 19/39]
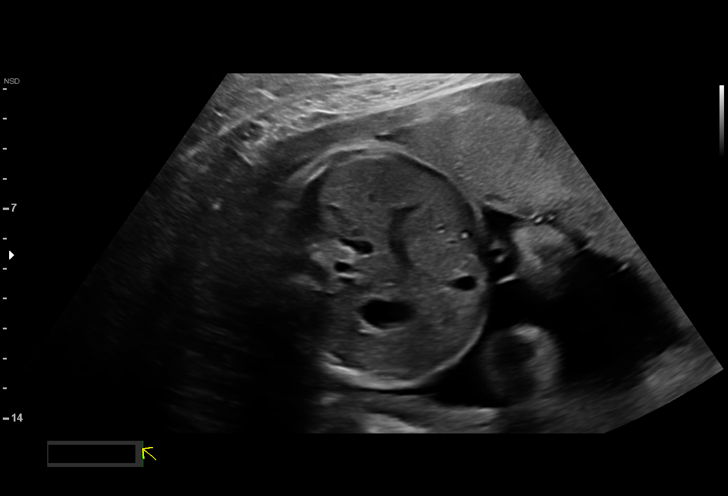
[im 22/39]
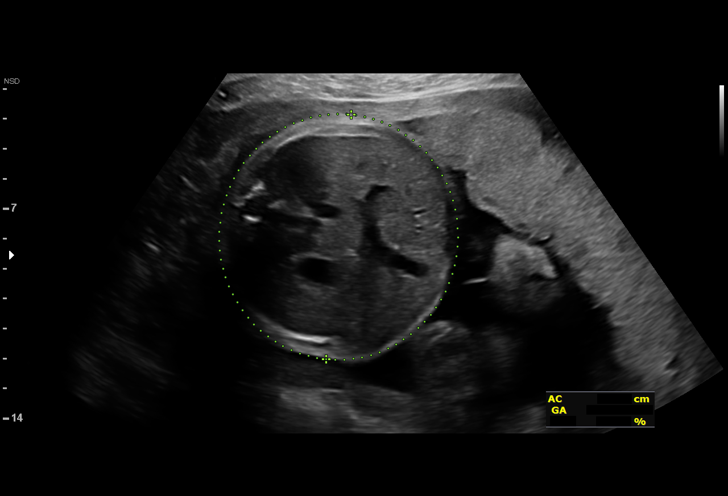
[im 24/39]
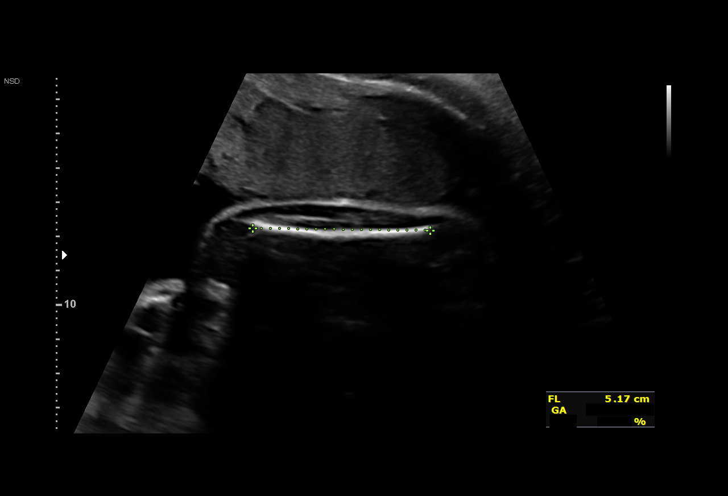
[im 27/39]
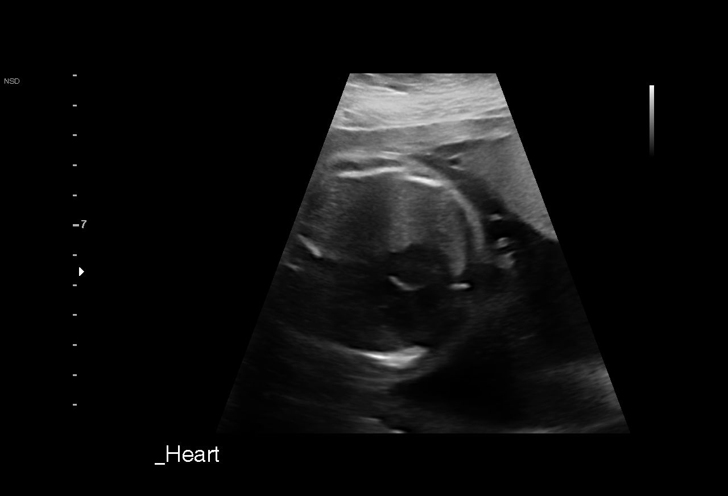
[im 30/39]
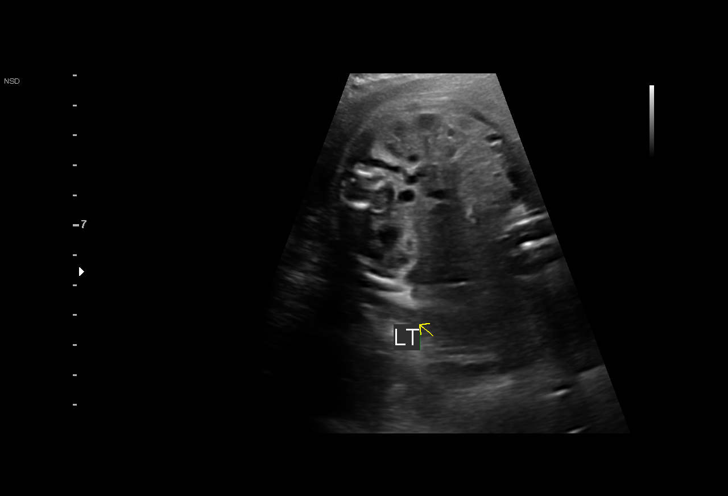
[im 33/39]
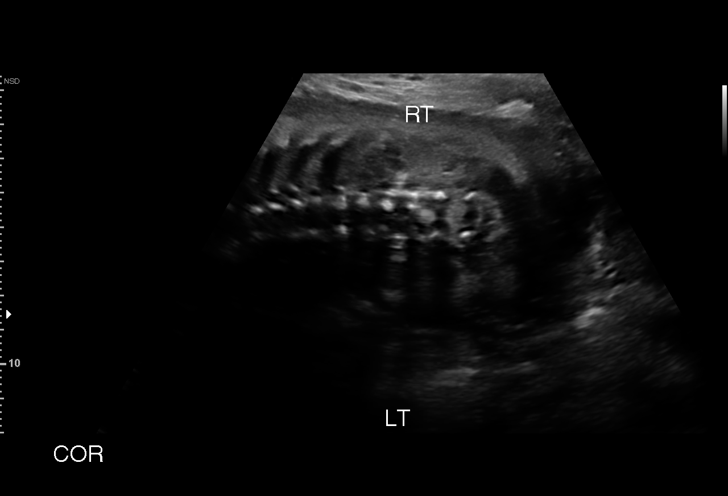
[im 36/39]
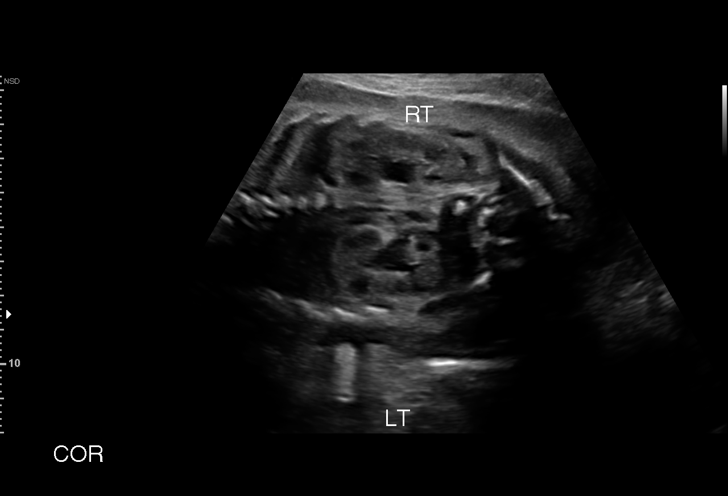
[im 39/39]
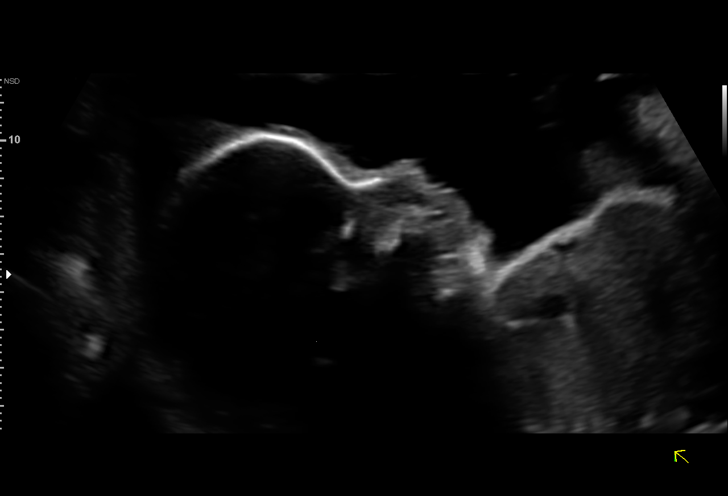

[14 of 28 positions shown; findings below may reference images not displayed]

BAMBUCAFE

Indications

 Hypertension - Chronic/Pre-existing (no
 meds)
 27 weeks gestation of pregnancy
 LR NIPS/ Neg AFp
Fetal Evaluation

 Num Of Fetuses:         1
 Fetal Heart Rate(bpm):  133
 Cardiac Activity:       Observed
 Presentation:           Breech
 Placenta:               Anterior
 P. Cord Insertion:      Previously Visualized

 Amniotic Fluid
 AFI FV:      Within normal limits

 AFI Sum(cm)     %Tile       Largest Pocket(cm)
 16.65           62

 RUQ(cm)       RLQ(cm)       LUQ(cm)        LLQ(cm)

Biometry

 BPD:      70.3  mm     G. Age:  28w 2d         79  %    CI:        76.21   %    70 - 86
                                                         FL/HC:      20.1   %    18.6 -
 HC:      255.2  mm     G. Age:  27w 5d         46  %    HC/AC:      1.00        1.05 -
 AC:      256.1  mm     G. Age:  29w 5d         98  %    FL/BPD:     72.8   %    71 - 87
 FL:       51.2  mm     G. Age:  27w 3d         47  %    FL/AC:      20.0   %    20 - 24
 HUM:      47.2  mm     G. Age:  27w 5d         64  %
 LV:        4.2  mm

 Est. FW:    8042  gm    2 lb 13 oz      94  %
OB History

 Gravidity:    6         Term:   1         SAB:   2
 TOP:          2        Living:  1
Gestational Age

 LMP:           27w 0d        Date:  04/22/21                 EDD:   01/27/22
 U/S Today:     28w 2d                                        EDD:   01/18/22
 Best:          27w 0d     Det. By:  LMP  (04/22/21)          EDD:   01/27/22
Anatomy

 Cranium:               Appears normal         Aortic Arch:            Previously seen
 Cavum:                 Previously seen        Ductal Arch:            Previously seen
 Ventricles:            Appears normal         Diaphragm:              Appears normal
 Choroid Plexus:        Previously seen        Stomach:                Appears normal, left
                                                                       sided
 Cerebellum:            Previously seen        Abdomen:                Appears normal
 Posterior Fossa:       Previously seen        Abdominal Wall:         Previously seen
 Nuchal Fold:           Previously seen        Cord Vessels:           Previously seen
 Face:                  Orbits and profile     Kidneys:                Appear normal
                        previously seen
 Lips:                  Previously seen        Bladder:                Appears normal
 Thoracic:              Appears normal         Spine:                  Previously seen
 Heart:                 Appears normal         Upper Extremities:      Previously seen
                        (4CH, axis, and
                        situs)
 RVOT:                  Previously seen        Lower Extremities:      Previously seen
 LVOT:                  Previously seen

 Other:  Female gender previously seen.Nasal bone, lenses, 3VV, VC,
         heels/feet, RIGHT open hand/5th digit visualized. Technically difficult
         due to fetal position.
Cervix Uterus Adnexa

 Cervix
 Not visualized (advanced GA >55wks)
Impression

 Chronic hypertension. Well-controlled without
 antihypertensives .

 Fetal growth is appropriate for gestational age .Amniotic fluid
 is normal and good fetal activity is seen .
Recommendations

 -An appointment was made for her to return in 5 weeks for
 fetal growth assessment.
                Barone, Harmeet

## 2023-07-15 IMAGING — US US MFM OB FOLLOW-UP
1 series · 14 of 28 positions shown · non-contrast
Comparison: none

[Series 1: us mfm ob follow-up · 14 of 41 slices shown]
[im 2/41]
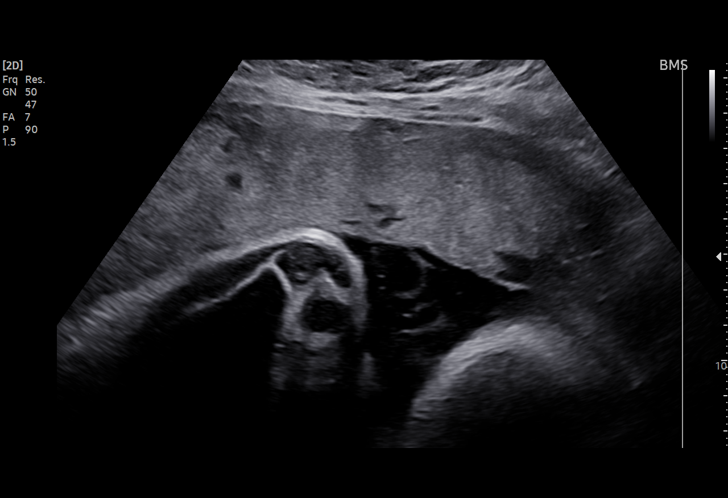
[im 5/41]
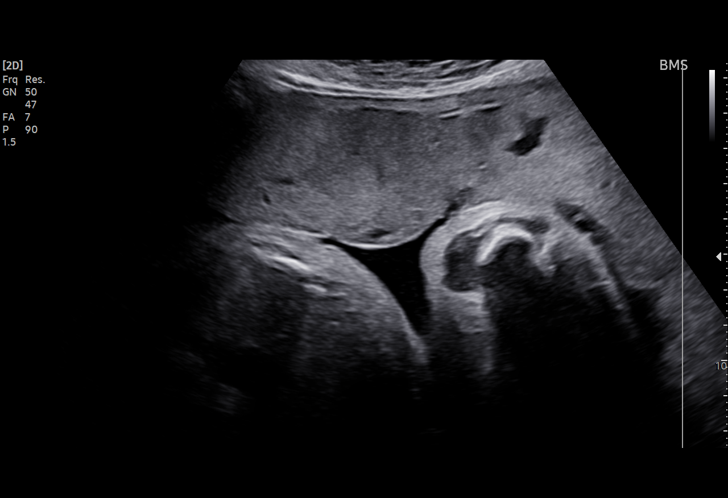
[im 8/41]
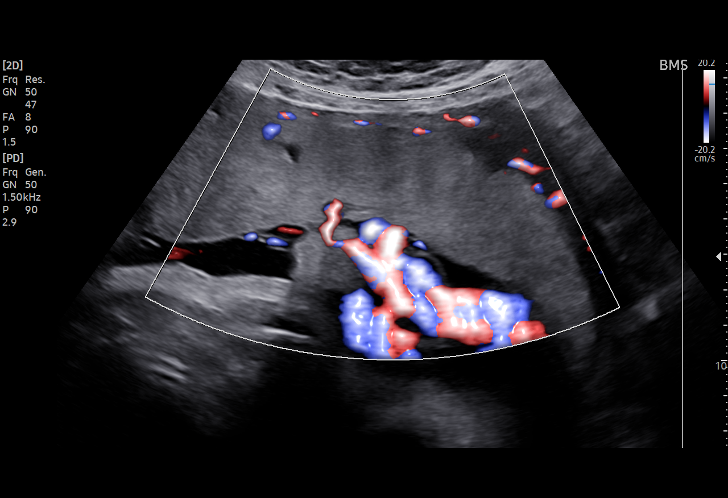
[im 11/41]
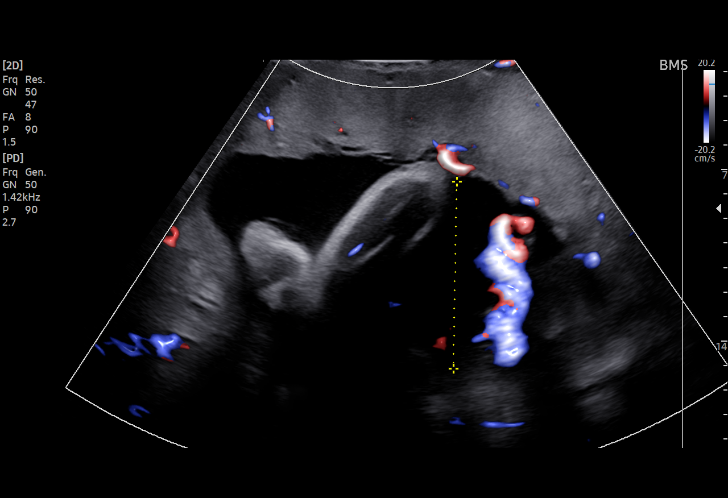
[im 14/41]
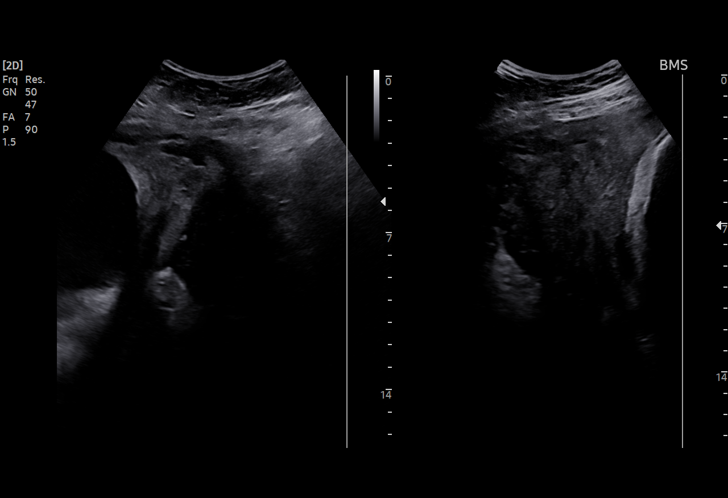
[im 17/41]
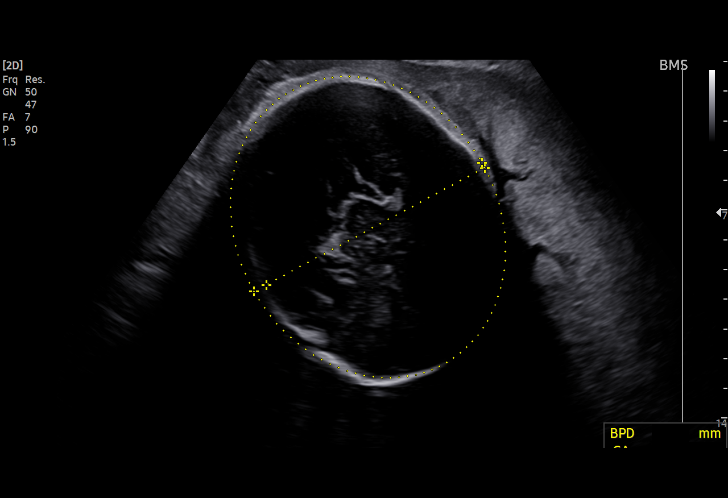
[im 20/41]
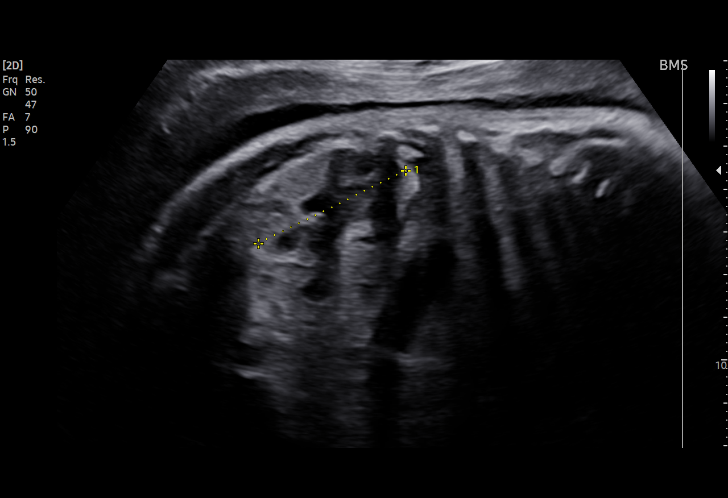
[im 23/41]
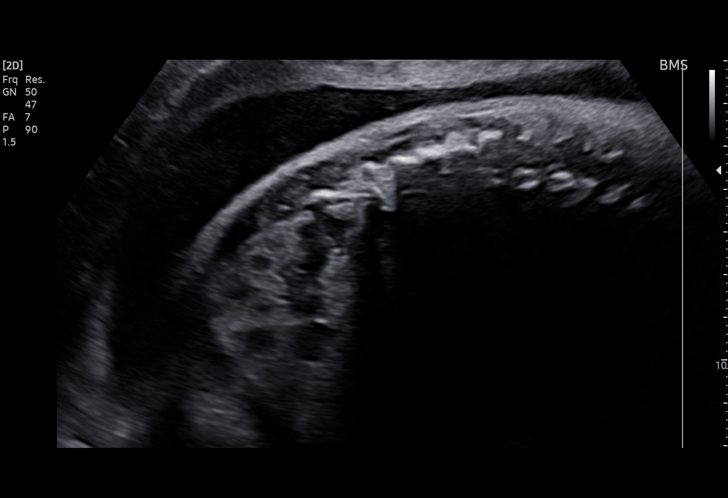
[im 26/41]
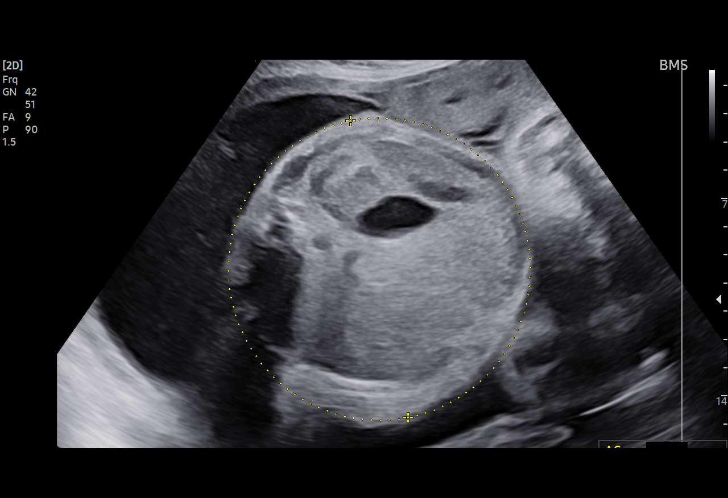
[im 29/41]
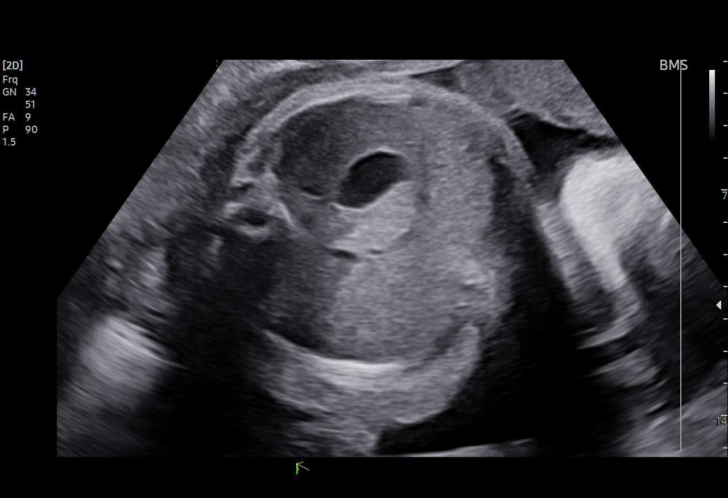
[im 32/41]
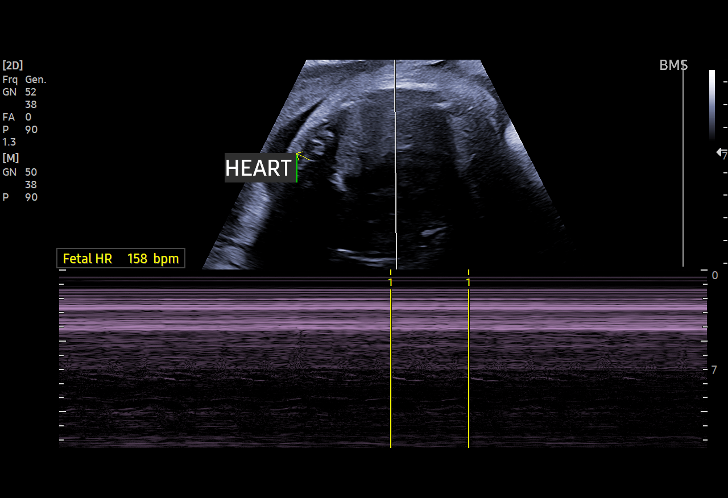
[im 35/41]
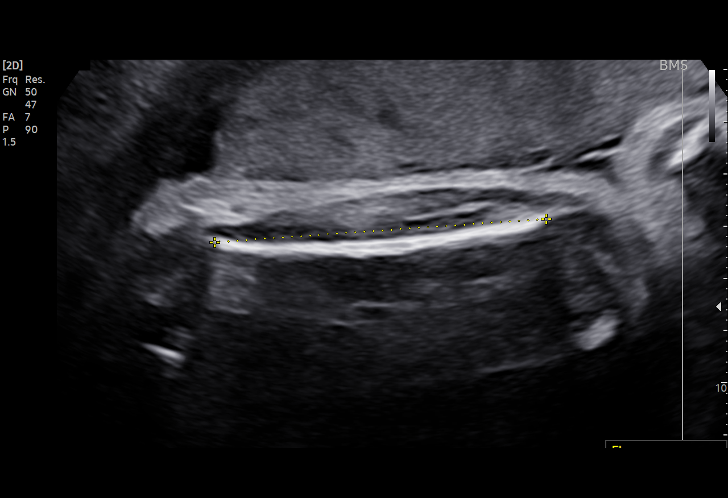
[im 38/41]
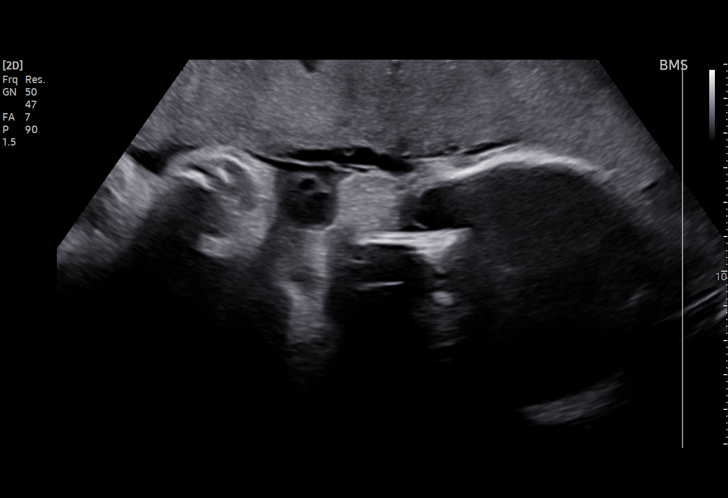
[im 41/41]
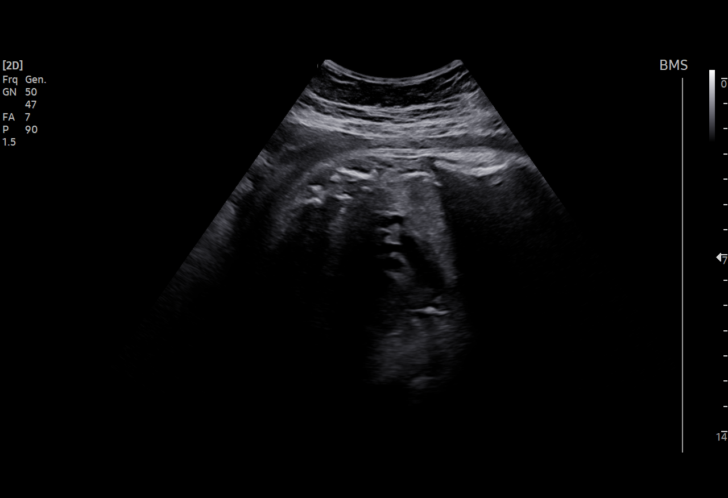

[14 of 28 positions shown; findings below may reference images not displayed]

Indications

 Hypertension - Chronic/Pre-existing (no
 meds)
 32 weeks gestation of pregnancy
 LR NIPS/ Neg AFp
Fetal Evaluation

 Num Of Fetuses:         1
 Fetal Heart Rate(bpm):  158
 Cardiac Activity:       Observed
 Presentation:           Cephalic
 Placenta:               Anterior
 P. Cord Insertion:      Visualized, central

 Amniotic Fluid
 AFI FV:      Within normal limits

 AFI Sum(cm)     %Tile       Largest Pocket(cm)
 24.89           96

 RUQ(cm)       RLQ(cm)       LUQ(cm)        LLQ(cm)

Biometry

 BPD:     83.03  mm     G. Age:  33w 3d         81  %    CI:        73.23   %    70 - 86
                                                         FL/HC:      20.7   %    19.1 -
 HC:    308.36   mm     G. Age:  34w 3d         77  %    HC/AC:      0.93        0.96 -
 AC:    331.62   mm     G. Age:  37w 0d       > 99  %    FL/BPD:     76.9   %    71 - 87
 FL:      63.81  mm     G. Age:  33w 0d         64  %    FL/AC:      19.2   %    20 - 24
 HUM:        57  mm     G. Age:  33w 1d         73  %
 Est. FW:    8615  gm    5 lb 14 oz    > 99  %
OB History

 Gravidity:    6         Term:   1         SAB:   2
 TOP:          2        Living:  1
Gestational Age

 LMP:           32w 0d        Date:  04/22/21                 EDD:   01/27/22
 U/S Today:     34w 3d                                        EDD:   01/10/22
 Best:          32w 0d     Det. By:  LMP  (04/22/21)          EDD:   01/27/22
Anatomy

 Cranium:               Appears normal         Aortic Arch:            Previously seen
 Cavum:                 Previously seen        Ductal Arch:            Previously seen
 Ventricles:            Appears normal         Diaphragm:              Appears normal
 Choroid Plexus:        Previously seen        Stomach:                Appears normal, left
                                                                       sided
 Cerebellum:            Previously seen        Abdomen:                Appears normal
 Posterior Fossa:       Previously seen        Abdominal Wall:         Previously seen
 Nuchal Fold:           Previously seen        Cord Vessels:           Previously seen
 Face:                  Orbits and profile     Kidneys:                Appear normal
                        previously seen
 Lips:                  Previously seen        Bladder:                Appears normal
 Thoracic:              Previously seen        Spine:                  Previously seen
 Heart:                 Previously seen        Upper Extremities:      Previously seen
 RVOT:                  Previously seen        Lower Extremities:      Previously seen
 LVOT:                  Previously seen

 Other:  Female gender previously seen.Nasal bone, lenses, 3VV, VC,
         heels/feet, RIGHT open hand/5th digit prev visualized. Technically
         difficult due to fetal position.
Cervix Uterus Adnexa

 Cervix
 Not visualized (advanced GA >04wks)

 Uterus
 No abnormality visualized.

 Right Ovary
 Not visualized.

 Left Ovary
 Not visualized.

 Cul De Sac
 No free fluid seen.

 Adnexa
 No adnexal mass visualized.
Impression
 Follow up growth due to chronic hypertension no meds
 Normal interval growth with measurements consistent with
 large for gestational age.
 Good fetal movement and amniotic fluid volume, the amniotic
 fluid is subjectively elevated.

 BP 130/84 mmHg
Recommendations

 Follow up growth scheduled in 4 weeks.
